# Patient Record
Sex: Female | Born: 1990 | Race: Black or African American | Hispanic: No | Marital: Married | Smoking: Never smoker
Health system: Southern US, Community
[De-identification: ages and names within clinical notes are randomized; demographics above are authoritative.]

## PROBLEM LIST (undated history)

## (undated) ENCOUNTER — Inpatient Hospital Stay (HOSPITAL_COMMUNITY): Payer: Self-pay

## (undated) DIAGNOSIS — N63 Unspecified lump in unspecified breast: Secondary | ICD-10-CM

## (undated) DIAGNOSIS — D649 Anemia, unspecified: Secondary | ICD-10-CM

## (undated) DIAGNOSIS — J189 Pneumonia, unspecified organism: Secondary | ICD-10-CM

## (undated) HISTORY — PX: FOOT SURGERY: SHX648

---
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
## 2012-08-31 ENCOUNTER — Encounter (HOSPITAL_COMMUNITY): Payer: Self-pay | Admitting: Emergency Medicine

## 2012-08-31 ENCOUNTER — Other Ambulatory Visit: Payer: Self-pay

## 2012-08-31 ENCOUNTER — Emergency Department (HOSPITAL_COMMUNITY)
Admission: EM | Admit: 2012-08-31 | Discharge: 2012-08-31 | Disposition: A | Discharge status: Home / Self Care | Payer: BC Managed Care – PPO | Attending: Emergency Medicine | Admitting: Emergency Medicine

## 2012-08-31 DIAGNOSIS — J111 Influenza due to unidentified influenza virus with other respiratory manifestations: Secondary | ICD-10-CM

## 2012-08-31 DIAGNOSIS — R509 Fever, unspecified: Secondary | ICD-10-CM | POA: Insufficient documentation

## 2012-08-31 DIAGNOSIS — J45909 Unspecified asthma, uncomplicated: Secondary | ICD-10-CM | POA: Insufficient documentation

## 2012-08-31 DIAGNOSIS — IMO0001 Reserved for inherently not codable concepts without codable children: Secondary | ICD-10-CM | POA: Insufficient documentation

## 2012-08-31 DIAGNOSIS — R059 Cough, unspecified: Secondary | ICD-10-CM | POA: Insufficient documentation

## 2012-08-31 DIAGNOSIS — R062 Wheezing: Secondary | ICD-10-CM | POA: Insufficient documentation

## 2012-08-31 DIAGNOSIS — R55 Syncope and collapse: Secondary | ICD-10-CM | POA: Insufficient documentation

## 2012-08-31 DIAGNOSIS — R112 Nausea with vomiting, unspecified: Secondary | ICD-10-CM | POA: Insufficient documentation

## 2012-08-31 DIAGNOSIS — J029 Acute pharyngitis, unspecified: Secondary | ICD-10-CM | POA: Insufficient documentation

## 2012-08-31 DIAGNOSIS — R05 Cough: Secondary | ICD-10-CM | POA: Insufficient documentation

## 2012-08-31 DIAGNOSIS — R52 Pain, unspecified: Secondary | ICD-10-CM | POA: Insufficient documentation

## 2012-08-31 DIAGNOSIS — Z79899 Other long term (current) drug therapy: Secondary | ICD-10-CM | POA: Insufficient documentation

## 2012-08-31 LAB — PREGNANCY, URINE: Preg Test, Ur: NEGATIVE

## 2012-08-31 LAB — POCT I-STAT, CHEM 8
Chloride: 107 mEq/L (ref 96–112)
HCT: 35 % — ABNORMAL LOW (ref 36.0–46.0)
Potassium: 2.9 mEq/L — ABNORMAL LOW (ref 3.5–5.1)

## 2012-08-31 MED ORDER — SODIUM CHLORIDE 0.9 % IV BOLUS (SEPSIS)
1000.0000 mL | Freq: Once | INTRAVENOUS | Status: AC
  Administered 2012-08-31: 1000 mL via INTRAVENOUS

## 2012-08-31 MED ORDER — MAGNESIUM SULFATE 40 MG/ML IJ SOLN
2.0000 g | Freq: Once | INTRAMUSCULAR | Status: AC
  Administered 2012-08-31: 2 g via INTRAVENOUS
  Filled 2012-08-31: qty 50

## 2012-08-31 MED ORDER — DIPHENHYDRAMINE HCL 50 MG/ML IJ SOLN
25.0000 mg | Freq: Once | INTRAMUSCULAR | Status: AC
  Administered 2012-08-31: 25 mg via INTRAVENOUS

## 2012-08-31 MED ORDER — ALBUTEROL SULFATE (5 MG/ML) 0.5% IN NEBU
5.0000 mg | INHALATION_SOLUTION | Freq: Once | RESPIRATORY_TRACT | Status: AC
  Administered 2012-08-31: 5 mg via RESPIRATORY_TRACT
  Filled 2012-08-31: qty 1

## 2012-08-31 MED ORDER — METHYLPREDNISOLONE SODIUM SUCC 125 MG IJ SOLR
125.0000 mg | Freq: Once | INTRAMUSCULAR | Status: AC
  Administered 2012-08-31: 125 mg via INTRAVENOUS
  Filled 2012-08-31 (×2): qty 2

## 2012-08-31 MED ORDER — DIPHENHYDRAMINE HCL 50 MG/ML IJ SOLN
INTRAMUSCULAR | Status: AC
  Filled 2012-08-31: qty 1

## 2012-08-31 MED ORDER — POTASSIUM CHLORIDE CRYS ER 20 MEQ PO TBCR
40.0000 meq | EXTENDED_RELEASE_TABLET | Freq: Once | ORAL | Status: AC
  Administered 2012-08-31: 40 meq via ORAL
  Filled 2012-08-31: qty 2

## 2012-08-31 MED ORDER — POTASSIUM CHLORIDE CRYS ER 20 MEQ PO TBCR
40.0000 meq | EXTENDED_RELEASE_TABLET | Freq: Two times a day (BID) | ORAL | Status: DC
Start: 2012-08-31 — End: 2012-08-31
  Administered 2012-08-31: 40 meq via ORAL
  Filled 2012-08-31: qty 2

## 2012-08-31 MED ORDER — ONDANSETRON 8 MG PO TBDP: 8.0000 mg | ORAL_TABLET | Freq: Three times a day (TID) | ORAL | Status: DC | PRN

## 2012-08-31 MED ORDER — ONDANSETRON HCL 4 MG/2ML IJ SOLN
4.0000 mg | Freq: Once | INTRAMUSCULAR | Status: AC
  Administered 2012-08-31: 4 mg via INTRAVENOUS
  Filled 2012-08-31: qty 2

## 2012-08-31 MED ORDER — IPRATROPIUM BROMIDE 0.02 % IN SOLN
0.5000 mg | Freq: Once | RESPIRATORY_TRACT | Status: AC
  Administered 2012-08-31: 0.5 mg via RESPIRATORY_TRACT
  Filled 2012-08-31: qty 2.5

## 2012-08-31 NOTE — ED Notes (Signed)
All signs of allergic reaction has resolved, no hives noted to right arm. Airway intact

## 2012-08-31 NOTE — ED Notes (Addendum)
Onset 2 days ago general body achy, productive cough unsure what sputum looked like, fever 103.0 seen Primary Doctor one day ago and given tamiflu, steroid, and antibiotic. Today felt passing out seeing dark.  Currently ax4 answering and following commands appropriate.  Headache 5/10 throbbing and lower middle abdominal pain 5/10 achy. Chest pain 5/10 achy

## 2012-08-31 NOTE — ED Notes (Signed)
Pt having reaction to meds, has redness and hives noted to right forearm and wrist area, informed Josh, PA and given benadryl ivp. Airway remains intact. spo2 100% on room air.

## 2012-08-31 NOTE — ED Provider Notes (Signed)
History     CSN: [id number]  Arrival date & time 08/31/12  1037   First MD Initiated Contact with Patient 08/31/12 1059      Chief Complaint  Patient presents with  . Cough  . Generalized Body Aches  . Near Syncope    (Consider location/radiation/quality/duration/timing/severity/associated sxs/prior treatment) HPI Comments: Patient with history of asthma presents with two-day history of flulike symptoms in the near syncopal episode this morning. Patient began having body aches, fever to 103F, cough with wheezing, sore throat, nausea/vomiting 2 days ago. Patient saw her primary care physician yesterday and was started on azithromycin, Tamiflu, and prednisone. She has not really been able to keep thesemedications down. This morning, the patient states that she had darkness of her vision while she was getting dressed. This was transient in nature. Patient did not have full syncope. She did not have chest pain or palpitations. No risk factors for PE other than recent drive to and from [place]. Patient states that she got up and walked around every 2-3 hours. She denies leg swelling or tenderness. Onset acute. Course is constant. Nothing makes symptoms better or worse.  Patient is a 22 y.o. female presenting with cough. The history is provided by the patient.  Cough Associated symptoms include chills, sore throat and myalgias. Pertinent negatives include no ear pain, no headaches, no rhinorrhea, no shortness of breath, no wheezing and no eye redness.    Past Medical History  Diagnosis Date  . Asthma     History reviewed. No pertinent past surgical history.  No family history on file.  History  Substance Use Topics  . Smoking status: Never Smoker   . Smokeless tobacco: Not on file  . Alcohol Use: No    OB History    Grav Para Term Preterm Abortions TAB SAB Ect Mult Living                  Review of Systems  Constitutional: Positive for fever, chills and fatigue.  HENT:  Positive for sore throat. Negative for ear pain, congestion, rhinorrhea, neck stiffness and sinus pressure.   Eyes: Negative for redness.  Respiratory: Positive for cough. Negative for shortness of breath and wheezing.   Cardiovascular: Negative for leg swelling.  Gastrointestinal: Positive for nausea and vomiting. Negative for abdominal pain and diarrhea.  Genitourinary: Negative for dysuria.  Musculoskeletal: Positive for myalgias.  Skin: Negative for rash.  Neurological: Negative for headaches.  Hematological: Negative for adenopathy.    Allergies  Review of patient's allergies indicates no known allergies.  Home Medications   Current Outpatient Rx  Name  Route  Sig  Dispense  Refill  . ALBUTEROL SULFATE HFA 108 (90 BASE) MCG/ACT IN AERS   Inhalation   Inhale 2 puffs into the lungs every 6 (six) hours as needed. For shortness of breath         . ALBUTEROL SULFATE (2.5 MG/3ML) 0.083% IN NEBU   Nebulization   Take 2.5 mg by nebulization every 6 (six) hours as needed.         . AZITHROMYCIN 250 MG PO TABS   Oral   Take 250 mg by mouth daily. 2 tablets on day 1, then 1 tablet on days 2-5         . OSELTAMIVIR PHOSPHATE 75 MG PO CAPS   Oral   Take 75 mg by mouth 2 (two) times daily.         Marland Kitchen PREDNISONE 20 MG PO TABS  Oral   Take 20 mg by mouth daily.         Marland Kitchen PSEUDOEPHEDRINE-NAPROXEN NA ER 120-220 MG PO TB12   Oral   Take 1 tablet by mouth 2 (two) times daily as needed. For pain/cough           BP 114/69  Pulse 108  Temp 99 F (37.2 C) (Oral)  Resp 26  SpO2 100%  Physical Exam  Nursing note and vitals reviewed. Constitutional: She appears well-developed and well-nourished.  HENT:  Head: Normocephalic and atraumatic. No trismus in the jaw.  Right Ear: Tympanic membrane, external ear and ear canal normal.  Left Ear: Tympanic membrane, external ear and ear canal normal.  Nose: Nose normal. No mucosal edema or rhinorrhea.  Mouth/Throat: Uvula is  midline, oropharynx is clear and moist and mucous membranes are normal. Mucous membranes are not dry. No oral lesions. No uvula swelling. No oropharyngeal exudate, posterior oropharyngeal edema, posterior oropharyngeal erythema or tonsillar abscesses.  Eyes: Conjunctivae normal are normal. Right eye exhibits no discharge. Left eye exhibits no discharge.  Neck: Normal range of motion. Neck supple.  Cardiovascular: Normal heart sounds.  Tachycardia present.   Pulmonary/Chest: Effort normal and breath sounds normal. No respiratory distress. She has no wheezes. She has no rales.  Abdominal: Soft. There is no tenderness.  Lymphadenopathy:    She has no cervical adenopathy.  Neurological: She is alert.  Skin: Skin is warm and dry.  Psychiatric: She has a normal mood and affect.    ED Course  Procedures (including critical care time)  Labs Reviewed  POCT I-STAT, CHEM 8 - Abnormal; Notable for the following:    Potassium 2.9 (*)     Hemoglobin 11.9 (*)     HCT 35.0 (*)     All other components within normal limits  PREGNANCY, URINE   No results found.   1. Influenza-like illness     11:09 AM Patient seen and examined. Work-up initiated. Medications ordered.   Vital signs reviewed and are as follows: Filed Vitals:   08/31/12 1058  BP: 114/69  Pulse: 108  Temp: 99 F (37.2 C)  Resp: 26   11:43 AM Patient had hives develop proximal to R IV site after administration of zofran and solumedrol. No h/o allergies. Appears localized. No resp distress. Benadryl ordered.   1:49 PM D/w Dr. Rhunette Croft. HR 85 bpm. Mg and K ordered earlier. On re-exam patient is having more significant wheezing than earlier. Albuterol ordered. Previous prednisone and Mg should help as well.   2:48 PM Patient states she her breathing is much better after breathing treatment. She states she does not feel like she needs another treatment. She will now try PO challenge. If she does okay, she will go home with her  previous medications.   Patient appears well and comfortable. HR 100 after albuterol. Wheezing improved. Mild rhonchi now that clears with cough. EKG reviewed with Dr. Rhunette Croft.    Date: 08/31/2012  Rate: 105  Rhythm: sinus tachycardia  QRS Axis: normal  Intervals: normal  ST/T Wave abnormalities: nonspecific T wave changes  Conduction Disutrbances:none  Narrative Interpretation:   Old EKG Reviewed: none available  3:19 PM Re-exam: patient appears well. HR is 90. Questions answered. Tolerating POs in room. Home with zofran for N/V.   Patient discharged to home. Encouraged to rest and drink plenty of fluids.  Patient told to return to ED or see their primary doctor if their symptoms worsen, high fever not controlled with  tylenol, persistent vomiting, they feel they are dehydrated, or if they have any other concerns.  Patient verbalized understanding and agreed with plan.     MDM  Patient with symptoms consistent with influenza in setting of asthma: body aches, fever, cough, wheezing, muscle aches. Symptoms improved with supportive treatments in ED. Do not suspect PE given this clinical picture: no LE edema, pain, or swelling. No h/o blood clot or recent prolonged travel. No FH of hypercoagulability. No estrogens. Tachycardia, near syncope likely 2/2 dehydration -- improved with initial fluid bolus.         Renne Crigler, [place] 08/31/12 [phone]

## 2012-09-01 NOTE — ED Provider Notes (Signed)
Medical screening examination/treatment/procedure(s) were performed by non-physician practitioner and as supervising physician I was immediately available for consultation/collaboration.  Verdella Laidlaw, MD 09/01/12 1055 

## 2013-10-19 ENCOUNTER — Encounter (HOSPITAL_COMMUNITY): Payer: Self-pay | Admitting: Emergency Medicine

## 2013-10-19 ENCOUNTER — Emergency Department (HOSPITAL_COMMUNITY)
Admission: EM | Admit: 2013-10-19 | Discharge: 2013-10-19 | Disposition: A | Discharge status: Home / Self Care | Payer: BC Managed Care – PPO | Attending: Emergency Medicine | Admitting: Emergency Medicine

## 2013-10-19 ENCOUNTER — Emergency Department (HOSPITAL_COMMUNITY): Payer: BC Managed Care – PPO

## 2013-10-19 DIAGNOSIS — Z79899 Other long term (current) drug therapy: Secondary | ICD-10-CM | POA: Insufficient documentation

## 2013-10-19 DIAGNOSIS — R0789 Other chest pain: Secondary | ICD-10-CM | POA: Insufficient documentation

## 2013-10-19 DIAGNOSIS — J45901 Unspecified asthma with (acute) exacerbation: Secondary | ICD-10-CM | POA: Insufficient documentation

## 2013-10-19 MED ORDER — PREDNISONE 20 MG PO TABS: 40.0000 mg | ORAL_TABLET | Freq: Every day | ORAL | Status: DC

## 2013-10-19 MED ORDER — PREDNISONE 20 MG PO TABS
40.0000 mg | ORAL_TABLET | Freq: Once | ORAL | Status: AC
  Administered 2013-10-19: 40 mg via ORAL
  Filled 2013-10-19: qty 2

## 2013-10-19 MED ORDER — ALBUTEROL SULFATE (2.5 MG/3ML) 0.083% IN NEBU: 2.5000 mg | INHALATION_SOLUTION | Freq: Four times a day (QID) | RESPIRATORY_TRACT | Status: DC | PRN

## 2013-10-19 MED ORDER — ALBUTEROL SULFATE HFA 108 (90 BASE) MCG/ACT IN AERS: 2.0000 | INHALATION_SPRAY | Freq: Four times a day (QID) | RESPIRATORY_TRACT | Status: DC | PRN

## 2013-10-19 MED ORDER — IPRATROPIUM-ALBUTEROL 0.5-2.5 (3) MG/3ML IN SOLN
5.0000 mL | Freq: Once | RESPIRATORY_TRACT | Status: AC
  Administered 2013-10-19: 5 mL via RESPIRATORY_TRACT
  Filled 2013-10-19: qty 3

## 2013-10-19 MED ORDER — IPRATROPIUM-ALBUTEROL 0.5-2.5 (3) MG/3ML IN SOLN
3.0000 mL | Freq: Once | RESPIRATORY_TRACT | Status: AC
  Administered 2013-10-19: 3 mL via RESPIRATORY_TRACT
  Filled 2013-10-19: qty 3

## 2013-10-19 NOTE — ED Notes (Signed)
Patient returned from X-ray 

## 2013-10-19 NOTE — ED Provider Notes (Signed)
CSN: [id number]     Arrival date & time 10/19/13  1031 History   First MD Initiated Contact with Patient 10/19/13 1102     Chief Complaint  Patient presents with  . Asthma  . Chest Pain     (Consider location/radiation/quality/duration/timing/severity/associated sxs/prior Treatment) HPI Comments: Lauren Thornton is a 23 year-old female with a past medical history of asthma, presenting the Emergency Department with a chief complaint of dyspnea for approximately 72 hours.  The patient reports using her nebulizer and albuterol inhaler without relief, last use 0700.  She reports associated chest tightness.  The patient reports productive cough.  She reports she has run out of her Albuterol nebulizer and inhaler   The history is provided by the patient, medical records and a parent. No language interpreter was used.    Past Medical History  Diagnosis Date  . Asthma    Past Surgical History  Procedure Laterality Date  . Foot surgery Right    No family history on file. History  Substance Use Topics  . Smoking status: Never Smoker   . Smokeless tobacco: Not on file  . Alcohol Use: No   OB History   Grav Para Term Preterm Abortions TAB SAB Ect Mult Living                 Review of Systems  Constitutional: Negative for fever and chills.  Respiratory: Positive for cough, chest tightness, shortness of breath and wheezing.   Cardiovascular: Negative for leg swelling.  Gastrointestinal: Negative for abdominal pain.      Allergies  Review of patient's allergies indicates no known allergies.  Home Medications   Current Outpatient Rx  Name  Route  Sig  Dispense  Refill  . albuterol (PROVENTIL HFA;VENTOLIN HFA) 108 (90 BASE) MCG/ACT inhaler   Inhalation   Inhale 2 puffs into the lungs every 6 (six) hours as needed. For shortness of breath         . albuterol (PROVENTIL) (2.5 MG/3ML) 0.083% nebulizer solution   Nebulization   Take 2.5 mg by nebulization every 6 (six) hours as  needed.         . Pseudoephedrine-Naproxen Na (ALEVE-D SINUS & COLD) 120-220 MG TB12   Oral   Take 1 tablet by mouth 2 (two) times daily as needed. For pain/cough         . albuterol (PROVENTIL HFA;VENTOLIN HFA) 108 (90 BASE) MCG/ACT inhaler   Inhalation   Inhale 2 puffs into the lungs every 6 (six) hours as needed for wheezing or shortness of breath.   1 Inhaler   1   . albuterol (PROVENTIL) (2.5 MG/3ML) 0.083% nebulizer solution   Nebulization   Take 3 mLs (2.5 mg total) by nebulization every 6 (six) hours as needed for wheezing or shortness of breath.   75 mL   12   . predniSONE (DELTASONE) 20 MG tablet   Oral   Take 2 tablets (40 mg total) by mouth daily. Take 40 mg by mouth daily for 3 days, then 20mg  by mouth daily for 3 days, then 10mg  daily for 3 days   12 tablet   0    BP 108/62  Pulse 90  Temp(Src) 98 F (36.7 C) (Oral)  Resp 20  Wt 135 lb (61.236 kg)  SpO2 100%  LMP 09/17/2013 Physical Exam  Nursing note and vitals reviewed. Constitutional: She is oriented to person, place, and time. She appears well-developed and well-nourished.  HENT:  Head: Normocephalic and atraumatic.  Neck: Neck supple.  Cardiovascular: Normal rate and regular rhythm.   Pulmonary/Chest: Effort normal. Not tachypneic. No respiratory distress. She has no decreased breath sounds. She has wheezes. She has rhonchi in the left upper field and the left lower field.  Patient is able to speak in complete sentences.    Abdominal: Soft. There is no tenderness. There is no rebound.  Musculoskeletal: Normal range of motion.  Neurological: She is alert and oriented to person, place, and time.  Skin: Skin is dry.  Psychiatric: She has a normal mood and affect. Her behavior is normal.    ED Course  Procedures (including critical care time) Labs Review Labs Reviewed - No data to display Imaging Review Dg Chest 2 View (if Patient Has Fever And/or Copd)  10/19/2013   CLINICAL DATA:  Asthma,  cough, chest pain, history pneumonia  EXAM: CHEST  2 VIEW  COMPARISON:  12/02/2005  FINDINGS: Normal heart size, mediastinal contours, and pulmonary vascularity.  Lungs clear.  Bones unremarkable.  No pneumothorax.  IMPRESSION: Normal exam.   Electronically Signed   By: Lavonia Dana M.D.   On: 10/19/2013 11:52    EKG Interpretation    Date/Time:  Sunday October 19 2013 10:36:42 EST Ventricular Rate:  97 PR Interval:  174 QRS Duration: 82 QT Interval:  344 QTC Calculation: 436 R Axis:   82 Text Interpretation:  Normal sinus rhythm Normal ECG No significant change since last tracing Confirmed by STEINL  MD, KEVIN ([id number]) on 10/19/2013 12:11:31 PM            MDM   Final diagnoses:  Asthma exacerbation   Pt with a history of asthma. Rhonchi on exam, XR to rule out pneumonia.  Oxygen saturation 100% on RA.  duoneb and prednisone ordered. Chest XR without pneumonia or acute findings.  EKG normal, no significant change since last tracing. 1223: re-eval pt reports chest tightness has improved.  Reports decrease in wheezing.  Mild rhonchi in Left upper lobe  deuoneb x 2 and reevaluate. Oxygen saturation 100% on RA 1245: Re-eval: Pt lungs clear to ausculation in all fields.  Requesting refill of all medicaitons.  Discussed lab results, imaging results, and treatment plan with the patient. Return precautions given. Reports understanding and no other concerns at this time.  Patient is stable for discharge at this time.  Meds given in ED:  Medications  ipratropium-albuterol (DUONEB) 0.5-2.5 (3) MG/3ML nebulizer solution 5 mL (5 mLs Nebulization Given 10/19/13 1121)  predniSONE (DELTASONE) tablet 40 mg (40 mg Oral Given 10/19/13 1120)  ipratropium-albuterol (DUONEB) 0.5-2.5 (3) MG/3ML nebulizer solution 3 mL (3 mLs Nebulization Given 10/19/13 1234)    New Prescriptions   ALBUTEROL (PROVENTIL HFA;VENTOLIN HFA) 108 (90 BASE) MCG/ACT INHALER    Inhale 2 puffs into the lungs every 6 (six) hours as  needed for wheezing or shortness of breath.   ALBUTEROL (PROVENTIL) (2.5 MG/3ML) 0.083% NEBULIZER SOLUTION    Take 3 mLs (2.5 mg total) by nebulization every 6 (six) hours as needed for wheezing or shortness of breath.   PREDNISONE (DELTASONE) 20 MG TABLET    Take 2 tablets (40 mg total) by mouth daily. Take 40 mg by mouth daily for 3 days, then 20mg  by mouth daily for 3 days, then 10mg  daily for 3 days      Lorrine Kin, PA-C 10/19/13 1256

## 2013-10-19 NOTE — ED Notes (Signed)
Patient transported to X-ray at this time (breathing treatment complete).

## 2013-10-19 NOTE — Discharge Instructions (Signed)
Call for a follow up appointment with a Family or Primary Care Provider.  °Return if Symptoms worsen.   °Take medication as prescribed.  ° ° °Emergency Department Resource Guide °1) Find a Doctor and Pay Out of Pocket °Although you won't have to find out who is covered by your insurance plan, it is a good idea to ask around and get recommendations. You will then need to call the office and see if the doctor you have chosen will accept you as a new patient and what types of options they offer for patients who are self-pay. Some doctors offer discounts or will set up payment plans for their patients who do not have insurance, but you will need to ask so you aren't surprised when you get to your appointment. ° °2) Contact Your Local Health Department °Not all health departments have doctors that can see patients for sick visits, but many do, so it is worth a call to see if yours does. If you don't know where your local health department is, you can check in your phone book. The CDC also has a tool to help you locate your state's health department, and many state websites also have listings of all of their local health departments. ° °3) Find a Walk-in Clinic °If your illness is not likely to be very severe or complicated, you may want to try a walk in clinic. These are popping up all over the country in pharmacies, drugstores, and shopping centers. They're usually staffed by nurse practitioners or physician assistants that have been trained to treat common illnesses and complaints. They're usually fairly quick and inexpensive. However, if you have serious medical issues or chronic medical problems, these are probably not your best option. ° °No Primary Care Doctor: °- Call Health Connect at  [phone] - they can help you locate a primary care doctor that  accepts your insurance, provides certain services, etc. °- Physician Referral Service- [phone] ° °Chronic Pain Problems: °Organization         Address  Phone    Notes  °[facility]  [phone] Patients need to be referred by their primary care doctor.  ° °Medication Assistance: °Organization         Address  Phone   Notes  °[place] [facility] [address] °[place], [place] [address] [phone] --Must be a resident of [place] °-- Must have NO insurance coverage whatsoever (no Medicaid/ Medicare, etc.) °-- The pt. MUST have a primary care doctor that directs their care regularly and follows them in the community °  °[facility]  [phone]   °[employer]  (888) 892-1162   ° °Agencies that provide inexpensive medical care: °Organization         Address  Phone   Notes  °[facility]  [phone]   °[facility]    [phone]   °[facility] [address] °[place], [place] [address] [phone]   °[facility] [address], °[place] [phone]   °[facility]    [phone]   °[facility]    [phone]   °[facility] ° [address], [place] Phone:  [phone], Fax:  [phone] Hours of Operation:  9 am - 6 pm, M-F.  Also accepts Medicaid/Medicare and self-pay.  °[facility] ° [address], [place]   [place] Phone: [phone], Fax: [phone]. Hours of Operation:  8:30 am - 5:30 pm, M-F.  Also accepts Medicaid and self-pay.  [facility] [address], [place] Phone: [phone]   [place], [place], [place] [phone], Ext. [phone] Mondays & Thursdays: 7-9 AM.  First 15 patients are seen on a first come, first serve basis.    [place] Providers:  Organization         Address  Phone   Notes  [facility] [address], [place] [phone] Also accepts self-pay patients.  [facility] [address], [place]  [phone]   [place], [address], [place] [phone]   [facility] [address], [place] [phone]   Lucianne Lei [address], [place]   [phone] Only accepts [place] Access [place] patients after they have their name applied to their card.   Self-Pay (no insurance) in [facility]:  Organization         Address  Phone   Notes  Sickle Cell Patients, [facility] [place] [phone]   [facility] [place] [phone]   [facility] [place]  [place], [place], [place] [phone]   [facility]/Dr. Osei-Bonsu  [address], [place] or [address], [place] [phone] Phone number for both [place] and [place] locations is the same.  [facility] [address], [place] [phone]   [facility] [address] [place] or [address] [phone]   [facility] [address], [place] [phone], phone; [phone], fax Sees patients 1st and 3rd Saturday of every month.  Must not qualify for public or private insurance (i.e. Medicaid, Medicare, [place] Health Choice, Veterans' Benefits)  Household income should be no more than 200% of the poverty level The clinic cannot treat you if you are pregnant or think you are pregnant  Sexually transmitted diseases are not treated at the clinic.    Dental Care: Organization         Address  Phone  Notes  [facility] Department of [facility] [phone] Accepts children up to age 47 who are enrolled in [place] or [place]; pregnant women with a Medicaid card; and  children who have applied for Medicaid or [place] Health Choice, but were declined, whose parents can pay a reduced fee at time of service.  [facility] Department of Operating Room Services  [address], [place] [phone] Accepts children up to age 20 who are enrolled in [place] or [place]; pregnant women with a Medicaid card; and children who have applied for Medicaid or [place] Health Choice, but were declined, whose parents can pay a reduced fee at time of service.  [facility]  [place] [phone] Patients are seen by appointment only. Walk-ins are not accepted. [place] will see patients 24 years of age and older. Monday - Tuesday (8am-5pm) Most Wednesdays (8:30-5pm) $30 per visit, cash only  [place]  [place]  [address], [facility] [phone] Patients are seen by appointment only. Walk-ins are not accepted. [place] will see patients 48 years of age and older. One Wednesday Evening (Monthly: Volunteer Based).  $30 per visit, cash only  [place]  [phone] for adults; Children under age 72, call Graduate Pediatric Dentistry at [phone]. Children aged 24-14, please call [phone] to request a pediatric application.  Dental services are provided in all areas of dental care including fillings, crowns and bridges, complete and partial dentures, implants, gum treatment, root canals, and extractions. Preventive care is also provided. Treatment is provided to both adults and children. Patients are selected via a lottery and there is often a waiting list.   [facility] [address], [place]  [phone] [email or web address]   [facility] [address] [place], [place] [phone], Ext. [phone] Second and Fourth Thursday of each month, opens at 6:30 AM; Clinic ends at 9 AM.  Patients are seen on a first-come first-served  basis, and a limited number are seen during each clinic.   [facility]  [address] Hillard Danker [place], [place] [phone]   Eligibility Requirements You must have lived in [place], [place], or [place] counties for at least the last three months.   You cannot be eligible for state or federal sponsored [employer], including [employer], [place], or [employer].   You generally cannot be eligible for healthcare insurance through your employer.    How to apply: Eligibility screenings are held every Tuesday and Wednesday afternoon from 1:00 pm until 4:00 pm. You do not need an appointment for the interview!  [facility] [address], [place], [place]   [place]  [phone]

## 2013-10-19 NOTE — ED Notes (Addendum)
Pt reports began with cold symptoms about 2-3 days ago that led to asthma symptoms. Pt reports right sided chest pain non radiating with shortness of breath and slight sweating. Pt talking in complete sentences without difficulty. Last neb treatment was this morning around 0700.

## 2013-10-20 NOTE — ED Provider Notes (Signed)
Medical screening examination/treatment/procedure(s) were conducted as a shared visit with non-physician practitioner(s) and myself.  I personally evaluated the patient during the encounter.  EKG Interpretation    Date/Time:  Sunday October 19 2013 10:36:42 EST Ventricular Rate:  97 PR Interval:  174 QRS Duration: 82 QT Interval:  344 QTC Calculation: 436 R Axis:   82 Text Interpretation:  Normal sinus rhythm Normal ECG No significant change since last tracing Confirmed by Delbert Vu  MD, Kiara Keep ([id number]) on 10/19/2013 12:11:31 PM            Pt with hx asthma, c/o non prod cough and wheezing. Wheezing bil on exam. Good air exchange. Alb neb.  Mirna Mires, MD 10/20/13 [phone]

## 2014-02-20 ENCOUNTER — Encounter (HOSPITAL_COMMUNITY): Payer: Self-pay | Admitting: Emergency Medicine

## 2014-02-20 DIAGNOSIS — Z792 Long term (current) use of antibiotics: Secondary | ICD-10-CM | POA: Insufficient documentation

## 2014-02-20 DIAGNOSIS — R197 Diarrhea, unspecified: Secondary | ICD-10-CM | POA: Insufficient documentation

## 2014-02-20 DIAGNOSIS — R42 Dizziness and giddiness: Secondary | ICD-10-CM | POA: Insufficient documentation

## 2014-02-20 DIAGNOSIS — J45909 Unspecified asthma, uncomplicated: Secondary | ICD-10-CM | POA: Insufficient documentation

## 2014-02-20 DIAGNOSIS — R51 Headache: Secondary | ICD-10-CM | POA: Insufficient documentation

## 2014-02-20 DIAGNOSIS — R110 Nausea: Secondary | ICD-10-CM | POA: Insufficient documentation

## 2014-02-20 DIAGNOSIS — E860 Dehydration: Secondary | ICD-10-CM | POA: Insufficient documentation

## 2014-02-20 LAB — CBC WITH DIFFERENTIAL/PLATELET
BASOS ABS: 0 10*3/uL (ref 0.0–0.1)
Basophils Relative: 1 % (ref 0–1)
EOS ABS: 0.1 10*3/uL (ref 0.0–0.7)
EOS PCT: 1 % (ref 0–5)
HEMATOCRIT: 35.9 % — AB (ref 36.0–46.0)
Hemoglobin: 12 g/dL (ref 12.0–15.0)
LYMPHS PCT: 51 % — AB (ref 12–46)
Lymphs Abs: 2.9 10*3/uL (ref 0.7–4.0)
MCH: 28.9 pg (ref 26.0–34.0)
MCHC: 33.4 g/dL (ref 30.0–36.0)
MCV: 86.5 fL (ref 78.0–100.0)
MONO ABS: 0.4 10*3/uL (ref 0.1–1.0)
Monocytes Relative: 7 % (ref 3–12)
Neutro Abs: 2.2 10*3/uL (ref 1.7–7.7)
Neutrophils Relative %: 40 % — ABNORMAL LOW (ref 43–77)
Platelets: 338 10*3/uL (ref 150–400)
RBC: 4.15 MIL/uL (ref 3.87–5.11)
RDW: 12.9 % (ref 11.5–15.5)
WBC: 5.6 10*3/uL (ref 4.0–10.5)

## 2014-02-20 NOTE — ED Notes (Signed)
Pt. report diarrhea with nausea for 1 week after eating fruit in a party , denies emesis or fever . No abdominal pain .

## 2014-02-21 ENCOUNTER — Emergency Department (HOSPITAL_COMMUNITY)
Admission: EM | Admit: 2014-02-21 | Discharge: 2014-02-21 | Disposition: A | Discharge status: Home / Self Care | Payer: BC Managed Care – PPO | Attending: Emergency Medicine | Admitting: Emergency Medicine

## 2014-02-21 DIAGNOSIS — R197 Diarrhea, unspecified: Secondary | ICD-10-CM

## 2014-02-21 LAB — COMPREHENSIVE METABOLIC PANEL
ALT: 10 U/L (ref 0–35)
AST: 16 U/L (ref 0–37)
Albumin: 3.7 g/dL (ref 3.5–5.2)
Alkaline Phosphatase: 97 U/L (ref 39–117)
BUN: 10 mg/dL (ref 6–23)
CALCIUM: 9.6 mg/dL (ref 8.4–10.5)
CO2: 25 meq/L (ref 19–32)
CREATININE: 0.75 mg/dL (ref 0.50–1.10)
Chloride: 103 mEq/L (ref 96–112)
GLUCOSE: 92 mg/dL (ref 70–99)
Potassium: 3.9 mEq/L (ref 3.7–5.3)
Sodium: 140 mEq/L (ref 137–147)
TOTAL PROTEIN: 7.3 g/dL (ref 6.0–8.3)
Total Bilirubin: <0.2 mg/dL — ABNORMAL LOW (ref 0.3–1.2)

## 2014-02-21 MED ORDER — SODIUM CHLORIDE 0.9 % IV BOLUS (SEPSIS)
1000.0000 mL | Freq: Once | INTRAVENOUS | Status: AC
  Administered 2014-02-21: 1000 mL via INTRAVENOUS

## 2014-02-21 MED ORDER — ACETAMINOPHEN 325 MG PO TABS
650.0000 mg | ORAL_TABLET | Freq: Once | ORAL | Status: AC
  Administered 2014-02-21: 650 mg via ORAL
  Filled 2014-02-21: qty 2

## 2014-02-21 NOTE — Discharge Instructions (Signed)
Hold antibiotics.  If you were given medicines take as directed.  If you are on coumadin or contraceptives realize their levels and effectiveness is altered by many different medicines.  If you have any reaction (rash, tongues swelling, other) to the medicines stop taking and see a physician.   Please follow up as directed and return to the ER or see a physician for new or worsening symptoms.  Thank you. Filed Vitals:   02/20/14 2332 02/21/14 0145 02/21/14 0241 02/21/14 0254  BP: 124/79 123/87 107/46 120/73  Pulse: 87 61 76 64  Temp: 98 F (36.7 C)     Resp: 20 21 18 20   SpO2: 100% 100% 98% 99%

## 2014-02-21 NOTE — ED Provider Notes (Signed)
CSN: [id number]     Arrival date & time 02/20/14  2321 History   First MD Initiated Contact with Patient 02/21/14 0107     Chief Complaint  Patient presents with  . Diarrhea     (Consider location/radiation/quality/duration/timing/severity/associated sxs/prior Treatment) HPI Comments: 23 year old female with no significant medical history, no recent antibiotics, no smoking or drinking presents with recurrent diarrhea since one week prior after eating fruit at a party. Patient has had 5-7 episodes a day of watery diarrhea no blood. No sick contacts or recent travel. No recent antibiotics or new medications except patient is placed on Cipro the urgent care however no improvement in symptoms and possibly worsening. No GI medical history. Patient is not vomiting and tolerating by mouth. Mild lightheadedness with standing.  Patient is a 23 y.o. female presenting with diarrhea. The history is provided by the patient.  Diarrhea Associated symptoms: no abdominal pain, no chills, no fever, no headaches and no vomiting     Past Medical History  Diagnosis Date  . Asthma    Past Surgical History  Procedure Laterality Date  . Foot surgery Right    No family history on file. History  Substance Use Topics  . Smoking status: Never Smoker   . Smokeless tobacco: Not on file  . Alcohol Use: No   OB History   Grav Para Term Preterm Abortions TAB SAB Ect Mult Living                 Review of Systems  Constitutional: Negative for fever and chills.  HENT: Negative for congestion.   Eyes: Negative for visual disturbance.  Respiratory: Negative for shortness of breath.   Cardiovascular: Negative for chest pain.  Gastrointestinal: Positive for nausea and diarrhea. Negative for vomiting and abdominal pain.  Genitourinary: Negative for dysuria and flank pain.  Musculoskeletal: Negative for back pain, neck pain and neck stiffness.  Skin: Negative for rash.  Neurological: Negative for  light-headedness and headaches.      Allergies  Review of patient's allergies indicates no known allergies.  Home Medications   Prior to Admission medications   Medication Sig Start Date End Date Taking? Authorizing Koury Roddy  albuterol (PROVENTIL HFA;VENTOLIN HFA) 108 (90 BASE) MCG/ACT inhaler Inhale 2 puffs into the lungs every 6 (six) hours as needed. For shortness of breath   Yes Historical Carmelita Amparo, MD  albuterol (PROVENTIL) (2.5 MG/3ML) 0.083% nebulizer solution Take 2.5 mg by nebulization every 6 (six) hours as needed for wheezing or shortness of breath.    Yes Historical Freida Nebel, MD  ciprofloxacin (CIPRO) 500 MG tablet Take 500 mg by mouth every 12 (twelve) hours. 02/15/14  Yes Historical Eren Puebla, MD  ondansetron (ZOFRAN-ODT) 8 MG disintegrating tablet Take 8 mg by mouth every 8 (eight) hours. 02/15/14  Yes Historical Jermani Eberlein, MD   BP 124/79  Pulse 87  Temp(Src) 98 F (36.7 C)  Resp 20  SpO2 100%  LMP 02/12/2014 Physical Exam  Nursing note and vitals reviewed. Constitutional: She is oriented to person, place, and time. She appears well-developed and well-nourished.  HENT:  Head: Normocephalic and atraumatic.  Mild diabetes membranes  Eyes: Conjunctivae are normal. Right eye exhibits no discharge. Left eye exhibits no discharge.  Neck: Normal range of motion. Neck supple. No tracheal deviation present.  Cardiovascular: Normal rate and regular rhythm.   Pulmonary/Chest: Effort normal and breath sounds normal.  Abdominal: Soft. She exhibits no distension. There is no tenderness. There is no guarding.  Musculoskeletal: She exhibits no edema.  Neurological: She is alert and oriented to person, place, and time.  Skin: Skin is warm. No rash noted.  Psychiatric: She has a normal mood and affect.    ED Course  Procedures (including critical care time) Labs Review Labs Reviewed  CBC WITH DIFFERENTIAL - Abnormal; Notable for the following:    HCT 35.9 (*)    Neutrophils  Relative % 40 (*)    Lymphocytes Relative 51 (*)    All other components within normal limits  COMPREHENSIVE METABOLIC PANEL - Abnormal; Notable for the following:    Total Bilirubin <0.2 (*)    All other components within normal limits    Imaging Review No results found.   EKG Interpretation None      MDM   Final diagnoses:  Diarrhea   Clinically mild dehydration and recurrent diarrhea with no red flags. Discussed holding antibiotics over the weekend and allowing the body to adjust. IV fluid bolus in ER and Tylenol from mild generalized headache.  Patient proven ER at IV fluids. Blood work unremarkable. Followup outpatient discussed. Patient had diarrhea prior to Cipro C. difficile was not ordered at this time. Patient be reassessed on Monday if no improvement in may require C. difficile testing at that time if persistent.  Results and differential diagnosis were discussed with the patient/parent/guardian. Close follow up outpatient was discussed, comfortable with the plan.   Medications  acetaminophen (TYLENOL) tablet 650 mg (650 mg Oral Given 02/21/14 0148)  sodium chloride 0.9 % bolus 1,000 mL (1,000 mLs Intravenous New Bag/Given 02/21/14 0220)    Filed Vitals:   02/20/14 2332 02/21/14 0145 02/21/14 0241 02/21/14 0254  BP: 124/79 123/87 107/46 120/73  Pulse: 87 61 76 64  Temp: 98 F (36.7 C)     Resp: 20 21 18 20   SpO2: 100% 100% 98% 99%       Mariea Clonts, MD 02/21/14 [phone]

## 2015-07-02 ENCOUNTER — Other Ambulatory Visit: Payer: Self-pay | Admitting: Family Medicine

## 2015-07-02 ENCOUNTER — Ambulatory Visit
Admission: RE | Admit: 2015-07-02 | Discharge: 2015-07-02 | Disposition: A | Discharge status: Home / Self Care | Payer: BLUE CROSS/BLUE SHIELD | Source: Ambulatory Visit | Attending: Family Medicine | Admitting: Family Medicine

## 2015-07-02 DIAGNOSIS — E010 Iodine-deficiency related diffuse (endemic) goiter: Secondary | ICD-10-CM

## 2015-07-02 DIAGNOSIS — E0789 Other specified disorders of thyroid: Secondary | ICD-10-CM

## 2015-07-17 ENCOUNTER — Encounter (HOSPITAL_COMMUNITY): Payer: Self-pay | Admitting: Nurse Practitioner

## 2015-07-17 ENCOUNTER — Emergency Department (HOSPITAL_COMMUNITY)
Admission: EM | Admit: 2015-07-17 | Discharge: 2015-07-17 | Disposition: A | Discharge status: Home / Self Care | Payer: BLUE CROSS/BLUE SHIELD | Attending: Emergency Medicine | Admitting: Emergency Medicine

## 2015-07-17 ENCOUNTER — Emergency Department (HOSPITAL_COMMUNITY): Payer: BLUE CROSS/BLUE SHIELD

## 2015-07-17 DIAGNOSIS — R0789 Other chest pain: Secondary | ICD-10-CM

## 2015-07-17 DIAGNOSIS — S29001A Unspecified injury of muscle and tendon of front wall of thorax, initial encounter: Secondary | ICD-10-CM | POA: Insufficient documentation

## 2015-07-17 DIAGNOSIS — Y9389 Activity, other specified: Secondary | ICD-10-CM | POA: Diagnosis not present

## 2015-07-17 DIAGNOSIS — Z79899 Other long term (current) drug therapy: Secondary | ICD-10-CM | POA: Diagnosis not present

## 2015-07-17 DIAGNOSIS — R519 Headache, unspecified: Secondary | ICD-10-CM

## 2015-07-17 DIAGNOSIS — Z792 Long term (current) use of antibiotics: Secondary | ICD-10-CM | POA: Diagnosis not present

## 2015-07-17 DIAGNOSIS — Y92410 Unspecified street and highway as the place of occurrence of the external cause: Secondary | ICD-10-CM | POA: Insufficient documentation

## 2015-07-17 DIAGNOSIS — Y998 Other external cause status: Secondary | ICD-10-CM | POA: Diagnosis not present

## 2015-07-17 DIAGNOSIS — R51 Headache: Secondary | ICD-10-CM

## 2015-07-17 DIAGNOSIS — J45909 Unspecified asthma, uncomplicated: Secondary | ICD-10-CM | POA: Insufficient documentation

## 2015-07-17 DIAGNOSIS — S0990XA Unspecified injury of head, initial encounter: Secondary | ICD-10-CM | POA: Diagnosis not present

## 2015-07-17 DIAGNOSIS — Z3202 Encounter for pregnancy test, result negative: Secondary | ICD-10-CM | POA: Insufficient documentation

## 2015-07-17 LAB — POC URINE PREG, ED: Preg Test, Ur: NEGATIVE

## 2015-07-17 MED ORDER — ACETAMINOPHEN 325 MG PO TABS
325.0000 mg | ORAL_TABLET | Freq: Once | ORAL | Status: AC
  Administered 2015-07-17: 325 mg via ORAL
  Filled 2015-07-17: qty 1

## 2015-07-17 MED ORDER — ONDANSETRON 4 MG PO TBDP
8.0000 mg | ORAL_TABLET | Freq: Once | ORAL | Status: AC
  Administered 2015-07-17: 8 mg via ORAL
  Filled 2015-07-17 (×2): qty 2

## 2015-07-17 MED ORDER — IBUPROFEN 400 MG PO TABS
400.0000 mg | ORAL_TABLET | Freq: Once | ORAL | Status: AC
  Administered 2015-07-17: 400 mg via ORAL
  Filled 2015-07-17: qty 1
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
## 2015-07-17 NOTE — ED Notes (Signed)
Pt left via w/c after all question answered. Pt stabler and verbalizes understanding of instructyions . Departs with family,

## 2016-02-07 ENCOUNTER — Emergency Department (HOSPITAL_COMMUNITY)
Admission: EM | Admit: 2016-02-07 | Discharge: 2016-02-07 | Disposition: A | Discharge status: Home / Self Care | Payer: BLUE CROSS/BLUE SHIELD | Attending: Emergency Medicine | Admitting: Emergency Medicine

## 2016-02-07 ENCOUNTER — Emergency Department (HOSPITAL_COMMUNITY): Payer: BLUE CROSS/BLUE SHIELD

## 2016-02-07 ENCOUNTER — Encounter (HOSPITAL_COMMUNITY): Payer: Self-pay | Admitting: Emergency Medicine

## 2016-02-07 DIAGNOSIS — R1084 Generalized abdominal pain: Secondary | ICD-10-CM | POA: Diagnosis not present

## 2016-02-07 DIAGNOSIS — J45909 Unspecified asthma, uncomplicated: Secondary | ICD-10-CM | POA: Insufficient documentation

## 2016-02-07 DIAGNOSIS — R112 Nausea with vomiting, unspecified: Secondary | ICD-10-CM | POA: Diagnosis not present

## 2016-02-07 DIAGNOSIS — R634 Abnormal weight loss: Secondary | ICD-10-CM | POA: Diagnosis not present

## 2016-02-07 DIAGNOSIS — R109 Unspecified abdominal pain: Secondary | ICD-10-CM | POA: Diagnosis present

## 2016-02-07 LAB — URINE MICROSCOPIC-ADD ON

## 2016-02-07 LAB — COMPREHENSIVE METABOLIC PANEL
ALBUMIN: 3.4 g/dL — AB (ref 3.5–5.0)
ALT: 12 U/L — ABNORMAL LOW (ref 14–54)
ANION GAP: 7 (ref 5–15)
AST: 19 U/L (ref 15–41)
Alkaline Phosphatase: 52 U/L (ref 38–126)
BILIRUBIN TOTAL: 0.3 mg/dL (ref 0.3–1.2)
BUN: 9 mg/dL (ref 6–20)
CO2: 23 mmol/L (ref 22–32)
Calcium: 9.4 mg/dL (ref 8.9–10.3)
Chloride: 106 mmol/L (ref 101–111)
Creatinine, Ser: 0.66 mg/dL (ref 0.44–1.00)
GLUCOSE: 100 mg/dL — AB (ref 65–99)
POTASSIUM: 4.3 mmol/L (ref 3.5–5.1)
Sodium: 136 mmol/L (ref 135–145)
TOTAL PROTEIN: 6.9 g/dL (ref 6.5–8.1)

## 2016-02-07 LAB — CBC
HEMATOCRIT: 36.7 % (ref 36.0–46.0)
HEMOGLOBIN: 12 g/dL (ref 12.0–15.0)
MCH: 27.5 pg (ref 26.0–34.0)
MCHC: 32.7 g/dL (ref 30.0–36.0)
MCV: 84.2 fL (ref 78.0–100.0)
Platelets: 285 10*3/uL (ref 150–400)
RBC: 4.36 MIL/uL (ref 3.87–5.11)
RDW: 13.3 % (ref 11.5–15.5)
WBC: 7.2 10*3/uL (ref 4.0–10.5)

## 2016-02-07 LAB — LIPASE, BLOOD: Lipase: 22 U/L (ref 11–51)

## 2016-02-07 LAB — URINALYSIS, ROUTINE W REFLEX MICROSCOPIC
Bilirubin Urine: NEGATIVE
Glucose, UA: NEGATIVE mg/dL
Hgb urine dipstick: NEGATIVE
Ketones, ur: NEGATIVE mg/dL
NITRITE: NEGATIVE
PH: 6.5 (ref 5.0–8.0)
Protein, ur: NEGATIVE mg/dL
SPECIFIC GRAVITY, URINE: 1.024 (ref 1.005–1.030)

## 2016-02-07 LAB — POC URINE PREG, ED: PREG TEST UR: NEGATIVE

## 2016-02-07 LAB — HIV ANTIBODY (ROUTINE TESTING W REFLEX): HIV SCREEN 4TH GENERATION: NONREACTIVE

## 2016-02-07 MED ORDER — PROCHLORPERAZINE EDISYLATE 5 MG/ML IJ SOLN
10.0000 mg | Freq: Once | INTRAMUSCULAR | Status: AC
Start: 2016-02-07 — End: 2016-02-07
  Administered 2016-02-07: 10 mg via INTRAVENOUS
  Filled 2016-02-07: qty 2

## 2016-02-07 MED ORDER — ONDANSETRON 4 MG PO TBDP: 4.0000 mg | ORAL_TABLET | Freq: Three times a day (TID) | ORAL | Status: DC | PRN

## 2016-02-07 MED ORDER — OMEPRAZOLE 20 MG PO CPDR: 20.0000 mg | DELAYED_RELEASE_CAPSULE | Freq: Every day | ORAL | Status: DC

## 2016-02-07 MED ORDER — IOPAMIDOL (ISOVUE-300) INJECTION 61%
INTRAVENOUS | Status: AC
  Administered 2016-02-07: 100 mL
  Filled 2016-02-07: qty 100

## 2016-02-07 MED ORDER — SODIUM CHLORIDE 0.9 % IV BOLUS (SEPSIS)
2000.0000 mL | Freq: Once | INTRAVENOUS | Status: AC
  Administered 2016-02-07: 2000 mL via INTRAVENOUS

## 2016-02-07 MED ORDER — HYDROCODONE-ACETAMINOPHEN 5-325 MG PO TABS: 1.0000 | ORAL_TABLET | Freq: Four times a day (QID) | ORAL | Status: DC | PRN

## 2016-02-07 MED ORDER — FAMOTIDINE IN NACL 20-0.9 MG/50ML-% IV SOLN
20.0000 mg | Freq: Once | INTRAVENOUS | Status: AC
  Administered 2016-02-07: 20 mg via INTRAVENOUS
  Filled 2016-02-07: qty 50

## 2016-02-07 NOTE — ED Provider Notes (Signed)
Lauren Thornton 03-Jul-1991  Pt here for abdominal pain x 2 weeks, given to me at shift change with CT abd/pelvis pending, initial eval and work up done by [employer], PA-C.  Results for orders placed or performed during the hospital encounter of 02/07/16  Lipase, blood  Result Value Ref Range   Lipase 22 11 - 51 U/L  Comprehensive metabolic panel  Result Value Ref Range   Sodium 136 135 - 145 mmol/L   Potassium 4.3 3.5 - 5.1 mmol/L   Chloride 106 101 - 111 mmol/L   CO2 23 22 - 32 mmol/L   Glucose, Bld 100 (H) 65 - 99 mg/dL   BUN 9 6 - 20 mg/dL   Creatinine, Ser 0.66 0.44 - 1.00 mg/dL   Calcium 9.4 8.9 - 10.3 mg/dL   Total Protein 6.9 6.5 - 8.1 g/dL   Albumin 3.4 (L) 3.5 - 5.0 g/dL   AST 19 15 - 41 U/L   ALT 12 (L) 14 - 54 U/L   Alkaline Phosphatase 52 38 - 126 U/L   Total Bilirubin 0.3 0.3 - 1.2 mg/dL   GFR calc non Af Amer >60 >60 mL/min   GFR calc Af Amer >60 >60 mL/min   Anion gap 7 5 - 15  CBC  Result Value Ref Range   WBC 7.2 4.0 - 10.5 K/uL   RBC 4.36 3.87 - 5.11 MIL/uL   Hemoglobin 12.0 12.0 - 15.0 g/dL   HCT 36.7 36.0 - 46.0 %   MCV 84.2 78.0 - 100.0 fL   MCH 27.5 26.0 - 34.0 pg   MCHC 32.7 30.0 - 36.0 g/dL   RDW 13.3 11.5 - 15.5 %   Platelets 285 150 - 400 K/uL  Urinalysis, Routine w reflex microscopic  Result Value Ref Range   Color, Urine YELLOW YELLOW   APPearance CLEAR CLEAR   Specific Gravity, Urine 1.024 1.005 - 1.030   pH 6.5 5.0 - 8.0   Glucose, UA NEGATIVE NEGATIVE mg/dL   Hgb urine dipstick NEGATIVE NEGATIVE   Bilirubin Urine NEGATIVE NEGATIVE   Ketones, ur NEGATIVE NEGATIVE mg/dL   Protein, ur NEGATIVE NEGATIVE mg/dL   Nitrite NEGATIVE NEGATIVE   Leukocytes, UA TRACE (A) NEGATIVE  Urine microscopic-add on  Result Value Ref Range   Squamous Epithelial / LPF 6-30 (A) NONE SEEN   WBC, UA 0-5 0 - 5 WBC/hpf   RBC / HPF 0-5 0 - 5 RBC/hpf   Bacteria, UA RARE (A) NONE SEEN  POC urine preg, ED  Result Value Ref Range   Preg Test, Ur NEGATIVE  NEGATIVE   Ct Abdomen Pelvis W Contrast  02/07/2016  CLINICAL DATA:  Mid abdominal pain with vomiting, diarrhea, headache, chills, poor appetite for 2 weeks. Weight loss. EXAM: CT ABDOMEN AND PELVIS WITH CONTRAST TECHNIQUE: Multidetector CT imaging of the abdomen and pelvis was performed using the standard protocol following bolus administration of intravenous contrast. CONTRAST:  122mL ISOVUE-300 IOPAMIDOL (ISOVUE-300) INJECTION 61% COMPARISON:  11/09/2011 FINDINGS: Lung bases are clear. Hyperdense circumscribed lesions in the breasts likely representing hemorrhagic cysts. No spiculation. These are likely benign. The liver, spleen, gallbladder, pancreas, adrenal glands, kidneys, abdominal aorta, inferior vena cava, and retroperitoneal lymph nodes are unremarkable. Stomach, small bowel, and colon are not abnormally distended. No free air or free fluid in the abdomen. Abdominal wall musculature appears intact. Pelvis: Appendix is normal. Uterus and ovaries are not enlarged. Bladder wall is not thickened. No free or loculated pelvic fluid collections. No pelvic mass or  lymphadenopathy. Mild lumbar curvature possibly positional. No destructive bone lesions. IMPRESSION: No acute process demonstrated in the abdomen or pelvis. No evidence of bowel obstruction. Electronically Signed   By: Lucienne Capers M.D.   On: 02/07/2016 06:50   Lab work was unremarkable, urinalysis pertinent for trace leukocytes, rare bacteria, no urinary complaints, no flank pain, on exam no CVA tenderness or suprapubic tenderness, do not suspect and will not treat for UTI/pyelo  CT of the abdomen and pelvis is negative for acute pathology. Reviewed results with the patient and with her mother who is at bedside. Will initiate PPI trial, give outpatient GI follow-up.  Return precautions reviewed, patient given omeprazole, Zofran and a few norco, encouraged to use only with severe abdominal pain, to avoid GI SE including constipation.  HIV  lab pending, encouraged to follow up on My Chart.   Patient was discharged in good condition with stable vital signs.  Filed Vitals:   02/07/16 0500 02/07/16 0515 02/07/16 0715 02/07/16 0800  BP: 118/73 105/65 113/73 110/70  Pulse: 85 77 88 82  Temp:      TempSrc:      Resp:   16 16  Height:      Weight:      SpO2: 100% 99% 100% 100%      Delsa Grana, PA-C 02/07/16 [id number]  Merryl Hacker, MD 02/07/16 1016

## 2016-02-07 NOTE — Discharge Instructions (Signed)

## 2016-02-07 NOTE — ED Notes (Signed)
Pt verbalized understanding of d/c instructions, prescriptions, and follow-up care. No further questions/concerns, VSS, ambulatory w/ steady gait (refused wheelchair) 

## 2016-02-07 NOTE — ED Notes (Signed)
Pt. reports mid abdominal pain with emesis , diarrhea, headache , chills and poor appetite onset 2 weeks ago .

## 2016-02-07 NOTE — ED Provider Notes (Signed)
CSN: [id number]     Arrival date & time 02/07/16  0021 History   First MD Initiated Contact with Patient 02/07/16 0334     Chief Complaint  Patient presents with  . Abdominal Pain     (Consider location/radiation/quality/duration/timing/severity/associated sxs/prior Treatment) The history is provided by the patient and medical records. No language interpreter was used.     Lauren Thornton is a 25 y.o. female  with a hx of asthma presents to the Emergency Department complaining of gradual, persistent, progressively worsening centralized abd pain onset 2 weeks ago. Associated symptoms include nausea, vomiting and diarrhea.  She reports 8-10lb unintentional weight loss in the last 2 weeks.  Pt reports all ssx occur within 10-15 min of eating.  She reports decreased PO intake due to this.  She reports pain resolves if she doesn't eat.  Pt reports it doesn't matter what she eats.  Pt also reports associated, generalized headache for several days.  Pt denies fever, chills, headache, neck pain, chest pain, syncope, dysuria.  Pt denies recent travel, sick contacts, tick bites.  No hx of abd surgeries.  LMP: last week   Past Medical History  Diagnosis Date  . Asthma    Past Surgical History  Procedure Laterality Date  . Foot surgery Right    No family history on file. Social History  Substance Use Topics  . Smoking status: Never Smoker   . Smokeless tobacco: None  . Alcohol Use: No   OB History    No data available     Review of Systems  Constitutional: Positive for appetite change and unexpected weight change. Negative for fever, diaphoresis and fatigue.  HENT: Negative for mouth sores.   Eyes: Negative for visual disturbance.  Respiratory: Negative for cough, chest tightness, shortness of breath and wheezing.   Cardiovascular: Negative for chest pain.  Gastrointestinal: Positive for nausea, vomiting, abdominal pain and diarrhea. Negative for constipation.  Endocrine: Negative for  polydipsia, polyphagia and polyuria.  Genitourinary: Negative for dysuria, urgency, frequency and hematuria.  Musculoskeletal: Negative for back pain and neck stiffness.  Skin: Negative for rash.  Allergic/Immunologic: Negative for immunocompromised state.  Neurological: Positive for light-headedness and headaches. Negative for syncope.  Hematological: Does not bruise/bleed easily.  Psychiatric/Behavioral: Negative for sleep disturbance. The patient is not nervous/anxious.       Allergies  Review of patient's allergies indicates no known allergies.  Home Medications   Prior to Admission medications   Medication Sig Start Date End Date Taking? Authorizing Provider  albuterol (PROVENTIL HFA;VENTOLIN HFA) 108 (90 BASE) MCG/ACT inhaler Inhale 2 puffs into the lungs every 6 (six) hours as needed. For shortness of breath    Historical Provider, MD  albuterol (PROVENTIL) (2.5 MG/3ML) 0.083% nebulizer solution Take 2.5 mg by nebulization every 6 (six) hours as needed for wheezing or shortness of breath.     Historical Provider, MD  ciprofloxacin (CIPRO) 500 MG tablet Take 500 mg by mouth every 12 (twelve) hours. 02/15/14   Historical Provider, MD  ondansetron (ZOFRAN-ODT) 8 MG disintegrating tablet Take 8 mg by mouth every 8 (eight) hours. 02/15/14   Historical Provider, MD   BP 105/65 mmHg  Pulse 77  Temp(Src) 98.7 F (37.1 C) (Oral)  Resp 14  Ht 5\' 1"  (1.549 m)  Wt 60.81 kg  BMI 25.34 kg/m2  SpO2 99%  LMP 01/31/2016 (Approximate) Physical Exam  Constitutional: She appears well-developed and well-nourished. No distress.  Awake, alert, nontoxic appearance  HENT:  Head: Normocephalic and atraumatic.  Mouth/Throat: Oropharynx is clear and moist. No oropharyngeal exudate.  Eyes: Conjunctivae are normal. No scleral icterus.  Neck: Normal range of motion. Neck supple.  Cardiovascular: Normal rate, regular rhythm, normal heart sounds and intact distal pulses.   No murmur  heard. Pulmonary/Chest: Effort normal and breath sounds normal. No respiratory distress. She has no wheezes.  Equal chest expansion  Abdominal: Soft. Bowel sounds are normal. She exhibits no mass. There is tenderness in the right lower quadrant and periumbilical area. There is no rebound, no guarding and no CVA tenderness.  Musculoskeletal: Normal range of motion. She exhibits no edema.  Neurological: She is alert.  Speech is clear and goal oriented Moves extremities without ataxia  Skin: Skin is warm and dry. She is not diaphoretic.  Healing rash to the left breast; no secondary infection.    Psychiatric: She has a normal mood and affect.  Nursing note and vitals reviewed.   ED Course  Procedures (including critical care time) Labs Review Labs Reviewed  COMPREHENSIVE METABOLIC PANEL - Abnormal; Notable for the following:    Glucose, Bld 100 (*)    Albumin 3.4 (*)    ALT 12 (*)    All other components within normal limits  URINALYSIS, ROUTINE W REFLEX MICROSCOPIC (NOT AT [facility]) - Abnormal; Notable for the following:    Leukocytes, UA TRACE (*)    All other components within normal limits  URINE MICROSCOPIC-ADD ON - Abnormal; Notable for the following:    Squamous Epithelial / LPF 6-30 (*)    Bacteria, UA RARE (*)    All other components within normal limits  LIPASE, BLOOD  CBC  HIV ANTIBODY (ROUTINE TESTING)  POC URINE PREG, ED     MDM   Final diagnoses:  Weight loss  Generalized abdominal pain  Non-intractable vomiting with nausea, vomiting of unspecified type   Bernadene Bell presents with abd pain, N/V/D after every PO intake.  Also with weight loss.  Recent shingles episode.  Question possible immunocompromise.  No hx of same.  HIV pending.  Labs reassuring.  No evidence of UTI.  Denies sexual activity or vaginal ssx.    6:45 AM At shift change, care transferred to Delsa Grana, PA-C who will follow labs, CT, reassess and determine disposition.      Jarrett Soho  Uziel Covault, PA-C 02/07/16 [id number]  Merryl Hacker, MD 02/07/16 1016

## 2016-02-14 ENCOUNTER — Encounter: Payer: Self-pay | Admitting: Gastroenterology

## 2016-03-15 ENCOUNTER — Encounter (HOSPITAL_COMMUNITY): Payer: Self-pay | Admitting: *Deleted

## 2016-03-15 ENCOUNTER — Emergency Department (HOSPITAL_COMMUNITY)
Admission: EM | Admit: 2016-03-15 | Discharge: 2016-03-15 | Disposition: A | Discharge status: Home / Self Care | Payer: BLUE CROSS/BLUE SHIELD | Attending: Emergency Medicine | Admitting: Emergency Medicine

## 2016-03-15 DIAGNOSIS — H578 Other specified disorders of eye and adnexa: Secondary | ICD-10-CM | POA: Insufficient documentation

## 2016-03-15 DIAGNOSIS — J45909 Unspecified asthma, uncomplicated: Secondary | ICD-10-CM | POA: Insufficient documentation

## 2016-03-15 DIAGNOSIS — H5711 Ocular pain, right eye: Secondary | ICD-10-CM | POA: Diagnosis present

## 2016-03-15 DIAGNOSIS — Z79899 Other long term (current) drug therapy: Secondary | ICD-10-CM | POA: Insufficient documentation

## 2016-03-15 DIAGNOSIS — H5789 Other specified disorders of eye and adnexa: Secondary | ICD-10-CM

## 2016-03-15 MED ORDER — DIPHENHYDRAMINE HCL 25 MG PO CAPS
25.0000 mg | ORAL_CAPSULE | Freq: Once | ORAL | Status: AC
  Administered 2016-03-15: 25 mg via ORAL
  Filled 2016-03-15: qty 1

## 2016-03-15 NOTE — ED Notes (Signed)
PT reports eye pain started yesterday. Pt reports using a new skin meds.

## 2016-03-15 NOTE — ED Provider Notes (Signed)
CSN: [id number]     Arrival date & time 03/15/16  1144 History  By signing my name below, I, [name], attest that this documentation has been prepared under the direction and in the presence of non-physician practitioner, [name], PA-C. Electronically Signed: [name], Scribe. 03/15/2016. 12:13 PM.   Chief Complaint  Patient presents with  . Eye Pain    The history is provided by the patient. No language interpreter was used.   HPI Comments:  Lauren Thornton is a 25 y.o. female who presents to the Emergency Department complaining of moderate swelling to the right eye which she woke up with this AM. Pt states she has been using a newly prescribed acne cream for ~ 3 days; states the cream did not enter her eye. She states the swelling has mildly improved since onset. Pt notes intermittent 4/10 stinging pain in the right eye. No benadryl taken PTA.  No alleviating factors noted. No SOB, trouble swallowing, or rash. No other new soaps, detergents, or other personal care products.  No known allergies.  Denies changes in vision.  VSS.  Past Medical History  Diagnosis Date  . Asthma    Past Surgical History  Procedure Laterality Date  . Foot surgery Right    History reviewed. No pertinent family history. Social History  Substance Use Topics  . Smoking status: Never Smoker   . Smokeless tobacco: None  . Alcohol Use: No   OB History    No data available     Review of Systems  Constitutional: Negative for fever.  HENT: Positive for facial swelling. Negative for trouble swallowing.   Eyes: Positive for pain.  Respiratory: Negative for shortness of breath.   Skin: Negative for rash.  All other systems reviewed and are negative.  Allergies  Review of patient's allergies indicates no known allergies.  Home Medications   Prior to Admission medications   Medication Sig Start Date End Date Taking? Authorizing Provider  albuterol (PROVENTIL HFA;VENTOLIN HFA) 108 (90 BASE)  MCG/ACT inhaler Inhale 2 puffs into the lungs every 6 (six) hours as needed. For shortness of breath    Historical Provider, MD  albuterol (PROVENTIL) (2.5 MG/3ML) 0.083% nebulizer solution Take 2.5 mg by nebulization every 6 (six) hours as needed for wheezing or shortness of breath.     Historical Provider, MD  HYDROcodone-acetaminophen (NORCO/VICODIN) 5-325 MG tablet Take 1-2 tablets by mouth every 6 (six) hours as needed for severe pain. 02/07/16   Delsa Grana, PA-C  Norgestimate-Ethinyl Estradiol Triphasic (ORTHO TRI-CYCLEN, 28,) 0.18/0.215/0.25 MG-35 MCG tablet Take 1 tablet by mouth daily.    Historical Provider, MD  omeprazole (PRILOSEC) 20 MG capsule Take 1 capsule (20 mg total) by mouth daily. 02/07/16   Delsa Grana, PA-C  ondansetron (ZOFRAN ODT) 4 MG disintegrating tablet Take 1 tablet (4 mg total) by mouth every 8 (eight) hours as needed for nausea or vomiting. 02/07/16   Delsa Grana, PA-C   BP 125/77 mmHg  Pulse 79  Resp 20  SpO2 99%   Physical Exam  Constitutional: She is oriented to person, place, and time. She appears well-developed and well-nourished.  HENT:  Head: Normocephalic and atraumatic.  Mouth/Throat: Oropharynx is clear and moist.  Scattered acne noted to face  Eyes: Conjunctivae and EOM are normal. Pupils are equal, round, and reactive to light.  Mild swelling noted to right peri-orbital region without erythema; EOMs fully intact and non-painful; no conjunctiva erythema or drainage; no observed FB  Neck: Normal range of motion.  Cardiovascular: Normal rate, regular rhythm and normal heart sounds.   Pulmonary/Chest: Effort normal and breath sounds normal.  Abdominal: Soft. Bowel sounds are normal.  Musculoskeletal: Normal range of motion.  Neurological: She is alert and oriented to person, place, and time.  Skin: Skin is warm and dry. No rash noted.  Psychiatric: She has a normal mood and affect.  Nursing note and vitals reviewed.   ED Course  Procedures    DIAGNOSTIC STUDIES:  Oxygen Saturation is 99% on RA, normal by my interpretation.    COORDINATION OF CARE:  12:10 PM Advised pt to cease use of face cream and to begin taking benadryl. Discussed treatment plan with pt at bedside and pt agreed to plan.    MDM   Final diagnoses:  Eye swelling, right   25 y.o. F here with right periorbital swelling after using new acne cream from PCP.  Likely allergic type reaction.  No other new products.  No signs of orbital or pre-septal cellulitis.  No conjunctival erythema or drainage.  EOMs fully intact and non-painful.  Denies visual change.  Recommended to discontinue use of cream. Start benadryl PRN.  FU with PCP.  Discussed plan with patient, he/she acknowledged understanding and agreed with plan of care.  Return precautions given for new or worsening symptoms.  I personally performed the services described in this documentation, which was scribed in my presence. The recorded information has been reviewed and is accurate.  Larene Pickett, PA-C 03/15/16 [place], MD 03/16/16 [phone]

## 2016-03-15 NOTE — ED Notes (Signed)
Declined W/C at D/C and was escorted to lobby by RN. 

## 2016-03-15 NOTE — Discharge Instructions (Signed)
Recommend to continue benadryl at home.  Recommend to stop using the topical medication. Follow-up with your primary care doctor.

## 2016-03-23 ENCOUNTER — Other Ambulatory Visit: Payer: Self-pay | Admitting: Obstetrics and Gynecology

## 2016-03-23 DIAGNOSIS — N6320 Unspecified lump in the left breast, unspecified quadrant: Secondary | ICD-10-CM

## 2016-03-29 ENCOUNTER — Other Ambulatory Visit: Payer: BLUE CROSS/BLUE SHIELD

## 2016-04-17 ENCOUNTER — Ambulatory Visit: Payer: BLUE CROSS/BLUE SHIELD | Admitting: Gastroenterology

## 2016-04-27 ENCOUNTER — Other Ambulatory Visit: Payer: BLUE CROSS/BLUE SHIELD

## 2016-05-24 ENCOUNTER — Other Ambulatory Visit: Payer: BLUE CROSS/BLUE SHIELD

## 2016-05-31 ENCOUNTER — Ambulatory Visit
Admission: RE | Admit: 2016-05-31 | Discharge: 2016-05-31 | Disposition: A | Discharge status: Home / Self Care | Payer: BLUE CROSS/BLUE SHIELD | Source: Ambulatory Visit | Attending: Obstetrics and Gynecology | Admitting: Obstetrics and Gynecology

## 2016-05-31 DIAGNOSIS — N6320 Unspecified lump in the left breast, unspecified quadrant: Secondary | ICD-10-CM

## 2017-04-09 ENCOUNTER — Encounter: Payer: Self-pay | Admitting: Obstetrics & Gynecology

## 2017-04-09 ENCOUNTER — Ambulatory Visit (INDEPENDENT_AMBULATORY_CARE_PROVIDER_SITE_OTHER): Payer: BLUE CROSS/BLUE SHIELD

## 2017-04-09 DIAGNOSIS — Z3201 Encounter for pregnancy test, result positive: Secondary | ICD-10-CM

## 2017-04-09 LAB — POCT PREGNANCY, URINE: Preg Test, Ur: POSITIVE — AB

## 2017-04-09 NOTE — Progress Notes (Signed)
Pt here today for pregnancy test.  Resulted positive.  Pt reports LMP 03/07/17.  EDD 12/12/17.  [place] office to provide proof of pregnancy letter to start prenatal care.  Pt stated understanding with no further questions.

## 2017-04-22 ENCOUNTER — Inpatient Hospital Stay (HOSPITAL_COMMUNITY): Payer: BLUE CROSS/BLUE SHIELD

## 2017-04-22 ENCOUNTER — Encounter (HOSPITAL_COMMUNITY): Payer: Self-pay | Admitting: *Deleted

## 2017-04-22 ENCOUNTER — Inpatient Hospital Stay (HOSPITAL_COMMUNITY)
Admission: AD | Admit: 2017-04-22 | Discharge: 2017-04-22 | Discharge status: Against Medical Advice | Payer: BLUE CROSS/BLUE SHIELD | Source: Ambulatory Visit | Attending: Obstetrics and Gynecology | Admitting: Obstetrics and Gynecology

## 2017-04-22 DIAGNOSIS — Z3A01 Less than 8 weeks gestation of pregnancy: Secondary | ICD-10-CM | POA: Insufficient documentation

## 2017-04-22 DIAGNOSIS — Z3201 Encounter for pregnancy test, result positive: Secondary | ICD-10-CM | POA: Insufficient documentation

## 2017-04-22 DIAGNOSIS — Z5321 Procedure and treatment not carried out due to patient leaving prior to being seen by health care provider: Secondary | ICD-10-CM | POA: Insufficient documentation

## 2017-04-22 DIAGNOSIS — O209 Hemorrhage in early pregnancy, unspecified: Secondary | ICD-10-CM

## 2017-04-22 DIAGNOSIS — O26851 Spotting complicating pregnancy, first trimester: Secondary | ICD-10-CM | POA: Diagnosis present

## 2017-04-22 LAB — URINALYSIS, ROUTINE W REFLEX MICROSCOPIC
Bilirubin Urine: NEGATIVE
Glucose, UA: NEGATIVE mg/dL
Hgb urine dipstick: NEGATIVE
Ketones, ur: NEGATIVE mg/dL
Leukocytes, UA: NEGATIVE
Nitrite: NEGATIVE
Protein, ur: NEGATIVE mg/dL
Specific Gravity, Urine: 1.025 (ref 1.005–1.030)
pH: 6 (ref 5.0–8.0)

## 2017-04-22 LAB — WET PREP, GENITAL
Clue Cells Wet Prep HPF POC: NONE SEEN
Sperm: NONE SEEN
Trich, Wet Prep: NONE SEEN
Yeast Wet Prep HPF POC: NONE SEEN

## 2017-04-22 LAB — HCG, QUANTITATIVE, PREGNANCY: hCG, Beta Chain, Quant, S: 27050 m[IU]/mL — ABNORMAL HIGH (ref <5)

## 2017-04-22 NOTE — MAU Provider Note (Signed)
History     CSN: [id number]  Arrival date & time 04/22/17  1634   None     Chief Complaint  Patient presents with  . Vaginal Bleeding    26 y.o. G1P0 [redacted]w[redacted]d presents to MAU stating that she had some pink to red spotting earlier today at 300pm. She denies having intercourse.    Past Medical History:  Diagnosis Date  . Asthma     Past Surgical History:  Procedure Laterality Date  . FOOT SURGERY Right     Family History  Problem Relation Age of Onset  . Asthma Mother   . Hypertension Mother   . Hypertension Father     Social History  Substance Use Topics  . Smoking status: Never Smoker  . Smokeless tobacco: Never Used  . Alcohol use No    OB History    Gravida Para Term Preterm AB Living   1             SAB TAB Ectopic Multiple Live Births                  Review of Systems  Genitourinary: Positive for vaginal bleeding.  All other systems reviewed and are negative.   Allergies  Patient has no known allergies.  Home Medications    BP 114/62   Pulse 95   Temp 98.1 F (36.7 C) (Oral)   Resp 16   LMP 03/07/2017   Physical Exam  Constitutional: She is oriented to person, place, and time. She appears well-developed and well-nourished.  HENT:  Head: Normocephalic and atraumatic.  Neck: Normal range of motion.  Cardiovascular: Normal rate and regular rhythm.   Pulmonary/Chest: Effort normal. No respiratory distress.  Abdominal: Soft. Bowel sounds are normal.  Genitourinary: Vagina normal.  Genitourinary Comments: Small amount brown discharge coming from cervix  Musculoskeletal: Normal range of motion.  Neurological: She is alert and oriented to person, place, and time.  Skin: Skin is warm and dry.  Psychiatric: She has a normal mood and affect. Her behavior is normal. Thought content normal.  Nursing note and vitals reviewed.   MAU Course  Procedures (including critical care time)  Labs Reviewed  WET PREP, GENITAL - Abnormal; Notable for the  following:       Result Value   WBC, Wet Prep HPF POC MODERATE (*)    All other components within normal limits  HCG, QUANTITATIVE, PREGNANCY - Abnormal; Notable for the following:    hCG, Beta Chain, Quant, S 27,050 (*)    All other components within normal limits  URINALYSIS, ROUTINE W REFLEX MICROSCOPIC - Abnormal; Notable for the following:    APPearance HAZY (*)    All other components within normal limits   No results found.   1. Vaginal bleeding in pregnancy, first trimester    P:  US showed a viable IUP at 6 weeks.  Discussed results with pt.  Bleeding precautions discussed.  Pt discharged home and will follow up in office.   MDM  Sign out to Latrelle Dodrill CNM @ 700pm

## 2017-04-22 NOTE — MAU Provider Note (Signed)
Urine sent to lab 

## 2017-04-22 NOTE — Progress Notes (Addendum)
G1@ 6.[redacted] wksga. Here due to bleeding that started this afernoon around 3 pm. Pinkish red color. No bleeding now.   1710: Provider notified. Report status of pt given. Orders received to order U/S and Beta HCG.   1726: Lab notified for stat labs.   1737: Lab at bs  1755: provider at bs assessing pt. Speculum exam done and wet prep  2022: pt still in U/s. Provider in unit and wants to be notified once pt returns from U/S  2135: pt left unit without discharge instructions.

## 2017-04-22 NOTE — Discharge Instructions (Signed)
Vaginal Bleeding During Pregnancy, First Trimester °A small amount of bleeding (spotting) from the vagina is common in early pregnancy. Sometimes the bleeding is normal and is not a problem, and sometimes it is a sign of something serious. Be sure to tell your doctor about any bleeding from your vagina right away. °Follow these instructions at home: °· Watch your condition for any changes. °· Follow your doctor's instructions about how active you can be. °· If you are on bed rest: °? You may need to stay in bed and only get up to use the bathroom. °? You may be allowed to do some activities. °? If you need help, make plans for someone to help you. °· Write down: °? The number of pads you use each day. °? How often you change pads. °? How soaked (saturated) your pads are. °· Do not use tampons. °· Do not douche. °· Do not have sex or orgasms until your doctor says it is okay. °· If you pass any tissue from your vagina, save the tissue so you can show it to your doctor. °· Only take medicines as told by your doctor. °· Do not take aspirin because it can make you bleed. °· Keep all follow-up visits as told by your doctor. °Contact a doctor if: °· You bleed from your vagina. °· You have cramps. °· You have labor pains. °· You have a fever that does not go away after you take medicine. °Get help right away if: °· You have very bad cramps in your back or belly (abdomen). °· You pass large clots or tissue from your vagina. °· You bleed more. °· You feel light-headed or weak. °· You pass out (faint). °· You have chills. °· You are leaking fluid or have a gush of fluid from your vagina. °· You pass out while pooping (having a bowel movement). °This information is not intended to replace advice given to you by your health care provider. Make sure you discuss any questions you have with your health care provider. °Document Released: 12/29/2013 Document Revised: 01/20/2016 Document Reviewed: 04/21/2013 °Elsevier Interactive  Patient Education © 2018 Elsevier Inc. ° °

## 2017-05-16 LAB — OB RESULTS CONSOLE ANTIBODY SCREEN: Antibody Screen: NEGATIVE

## 2017-05-16 LAB — OB RESULTS CONSOLE GC/CHLAMYDIA
CHLAMYDIA, DNA PROBE: NEGATIVE
Gonorrhea: NEGATIVE

## 2017-05-16 LAB — OB RESULTS CONSOLE RUBELLA ANTIBODY, IGM: Rubella: IMMUNE

## 2017-05-16 LAB — OB RESULTS CONSOLE ABO/RH: RH TYPE: POSITIVE

## 2017-05-16 LAB — OB RESULTS CONSOLE HIV ANTIBODY (ROUTINE TESTING): HIV: NONREACTIVE

## 2017-05-16 LAB — OB RESULTS CONSOLE HEPATITIS B SURFACE ANTIGEN: Hepatitis B Surface Ag: NEGATIVE

## 2017-05-16 LAB — OB RESULTS CONSOLE RPR: RPR: NONREACTIVE

## 2017-06-08 NOTE — Telephone Encounter (Signed)
06/08/2017 Received fax referral from [facility] for upcoming appointment with Dr. Martinique on 06/12/2017 at 2:40 pm.  Records given to [facility].  cbr

## 2017-06-11 NOTE — Progress Notes (Signed)
Cardiology Office Note   Date:  06/12/2017   ID:  Shamya, Macfadden 12-06-1990, MRN [id number]  PCP:  Thurman Coyer, MD  Cardiologist:   Peter Martinique, MD   Chief Complaint  Patient presents with  . Follow-up    NP.  Marland Kitchen Palpitations  . Shortness of Breath      History of Present Illness: Lauren Thornton is a 26 y.o. female who is seen at the request of Dr. Garwin Brothers for evaluation of palpitations. She is now [redacted] weeks pregnant. She notes for 2-3 weeks that her heart has been beating faster. Notices this more when resting or going up stairs. No skipping. May last some minutes. No dizziness, syncope, chest pain or SOB. No history of murmur. She is very anxious about this being her first pregnancy. Does not drink caffeine regularly. No Etoh or tobacco use. Generally in excellent health.     Past Medical History:  Diagnosis Date  . Asthma     Past Surgical History:  Procedure Laterality Date  . FOOT SURGERY Right      Current Outpatient Prescriptions  Medication Sig Dispense Refill  . albuterol (PROVENTIL HFA;VENTOLIN HFA) 108 (90 BASE) MCG/ACT inhaler Inhale 2 puffs into the lungs every 6 (six) hours as needed. For shortness of breath    . albuterol (PROVENTIL) (2.5 MG/3ML) 0.083% nebulizer solution Take 2.5 mg by nebulization every 6 (six) hours as needed for wheezing or shortness of breath.      No current facility-administered medications for this visit.     Allergies:   Patient has no known allergies.    Social History:  The patient  reports that she has never smoked. She has never used smokeless tobacco. She reports that she does not drink alcohol or use drugs.   Family History:  The patient's family history includes Asthma in her mother; Hypertension in her father and mother.    ROS:  Please see the history of present illness.   Otherwise, review of systems are positive for none.   All other systems are reviewed and negative.    PHYSICAL EXAM: VS:  BP 110/60  Comment: Right arm.  Pulse 91   Ht 5\' 1"  (1.549 m)   Wt 148 lb (67.1 kg)   LMP 03/07/2017   BMI 27.96 kg/m  , BMI Body mass index is 27.96 kg/m. GEN: Well nourished, well developed, gravid female in no acute distress  HEENT: normal  Neck: no JVD, carotid bruits, or masses Cardiac: RRR; no murmurs, rubs, or gallops,no edema  Respiratory:  clear to auscultation bilaterally, normal work of breathing GI: soft, nontender, nondistended, + BS MS: no deformity or atrophy  Skin: warm and dry, no rash Neuro:  Strength and sensation are intact Psych: euthymic mood, full affect   EKG:  EKG is ordered today. The ekg ordered today demonstrates NSR rate 91. Normal Ecg. I have personally reviewed and interpreted this study.    Recent Labs: No results found for requested labs within last 8760 hours.    Lipid Panel No results found for: CHOL, TRIG, HDL, CHOLHDL, VLDL, LDLCALC, LDLDIRECT    Wt Readings from Last 3 Encounters:  06/12/17 148 lb (67.1 kg)  02/07/16 134 lb 1 oz (60.8 kg)  10/19/13 135 lb (61.2 kg)    Dated 02/07/16: Hgb 12. Chemistries normal.  TSH normal   Other studies Reviewed: Additional studies/ records that were reviewed today include: none. Review of the above records demonstrates: N/A   ASSESSMENT AND  PLAN:  1.  Symptoms of tachycardia. Her history is most consistent with sinus tachycardia. Some of this related to her pregnancy and exacerbated by anxiety. She has a normal Exam and Ecg. Labs OK. I have just reassured her concerning this. I don't think she needs any other cardiac evaluation. I have encouraged her to remain active and exercise during pregnancy. Needs to eat healthy. I will see as needed.   Current medicines are reviewed at length with the patient today.  The patient does not have concerns regarding medicines.  The following changes have been made:  no change  Labs/ tests ordered today include:   Orders Placed This Encounter  Procedures  . EKG  12-Lead     Disposition:   FU with me PRN  Signed, Peter Martinique, MD  06/12/2017 3:43 PM    [facility] [address], [place], [place], [address] Phone [phone], Fax [phone]

## 2017-06-12 ENCOUNTER — Encounter: Payer: Self-pay | Admitting: Cardiology

## 2017-06-12 ENCOUNTER — Telehealth: Payer: Self-pay | Admitting: Cardiology

## 2017-06-12 ENCOUNTER — Ambulatory Visit (INDEPENDENT_AMBULATORY_CARE_PROVIDER_SITE_OTHER): Payer: BLUE CROSS/BLUE SHIELD | Admitting: Cardiology

## 2017-06-12 VITALS — BP 110/60 | HR 91 | Ht 61.0 in | Wt 148.0 lb

## 2017-06-12 DIAGNOSIS — R000 Tachycardia, unspecified: Secondary | ICD-10-CM

## 2017-07-06 ENCOUNTER — Other Ambulatory Visit: Payer: Self-pay | Admitting: Obstetrics and Gynecology

## 2017-07-06 DIAGNOSIS — N63 Unspecified lump in unspecified breast: Secondary | ICD-10-CM

## 2017-07-11 ENCOUNTER — Ambulatory Visit
Admission: RE | Admit: 2017-07-11 | Discharge: 2017-07-11 | Disposition: A | Discharge status: Home / Self Care | Payer: BLUE CROSS/BLUE SHIELD | Source: Ambulatory Visit | Attending: Obstetrics and Gynecology | Admitting: Obstetrics and Gynecology

## 2017-07-11 ENCOUNTER — Other Ambulatory Visit: Payer: Self-pay | Admitting: Obstetrics and Gynecology

## 2017-07-11 DIAGNOSIS — N63 Unspecified lump in unspecified breast: Secondary | ICD-10-CM

## 2017-08-14 ENCOUNTER — Encounter (HOSPITAL_COMMUNITY): Payer: Self-pay

## 2017-08-14 ENCOUNTER — Other Ambulatory Visit: Payer: Self-pay

## 2017-08-14 ENCOUNTER — Inpatient Hospital Stay (HOSPITAL_COMMUNITY)
Admission: AD | Admit: 2017-08-14 | Discharge: 2017-08-14 | Disposition: A | Discharge status: Home / Self Care | Payer: BLUE CROSS/BLUE SHIELD | Source: Ambulatory Visit | Attending: Obstetrics and Gynecology | Admitting: Obstetrics and Gynecology

## 2017-08-14 DIAGNOSIS — M545 Low back pain, unspecified: Secondary | ICD-10-CM

## 2017-08-14 DIAGNOSIS — O99612 Diseases of the digestive system complicating pregnancy, second trimester: Secondary | ICD-10-CM | POA: Diagnosis not present

## 2017-08-14 DIAGNOSIS — R109 Unspecified abdominal pain: Secondary | ICD-10-CM | POA: Diagnosis present

## 2017-08-14 DIAGNOSIS — K219 Gastro-esophageal reflux disease without esophagitis: Secondary | ICD-10-CM | POA: Insufficient documentation

## 2017-08-14 DIAGNOSIS — O26892 Other specified pregnancy related conditions, second trimester: Secondary | ICD-10-CM | POA: Diagnosis not present

## 2017-08-14 DIAGNOSIS — Z3A22 22 weeks gestation of pregnancy: Secondary | ICD-10-CM | POA: Diagnosis not present

## 2017-08-14 DIAGNOSIS — M549 Dorsalgia, unspecified: Secondary | ICD-10-CM | POA: Insufficient documentation

## 2017-08-14 DIAGNOSIS — M25551 Pain in right hip: Secondary | ICD-10-CM | POA: Insufficient documentation

## 2017-08-14 LAB — URINALYSIS, ROUTINE W REFLEX MICROSCOPIC
BILIRUBIN URINE: NEGATIVE
Glucose, UA: NEGATIVE mg/dL
HGB URINE DIPSTICK: NEGATIVE
KETONES UR: NEGATIVE mg/dL
NITRITE: NEGATIVE
PH: 6 (ref 5.0–8.0)
Protein, ur: NEGATIVE mg/dL
SPECIFIC GRAVITY, URINE: 1.015 (ref 1.005–1.030)

## 2017-08-14 MED ORDER — CYCLOBENZAPRINE HCL 5 MG PO TABS: 5.0000 mg | ORAL_TABLET | Freq: Two times a day (BID) | ORAL | 0 refills | Status: DC | PRN

## 2017-08-14 MED ORDER — CYCLOBENZAPRINE HCL 5 MG PO TABS
5.0000 mg | ORAL_TABLET | Freq: Once | ORAL | Status: AC
  Administered 2017-08-14: 5 mg via ORAL
  Filled 2017-08-14: qty 1

## 2017-08-14 NOTE — Discharge Instructions (Signed)
Back Pain in Pregnancy Back pain during pregnancy is common. Back pain may be caused by several factors that are related to changes during your pregnancy. Follow these instructions at home: Managing pain, stiffness, and swelling  If directed, apply ice for sudden (acute) back pain. ? Put ice in a plastic bag. ? Place a towel between your skin and the bag. ? Leave the ice on for 20 minutes, 2-3 times per day.  If directed, apply heat to the affected area before you exercise: ? Place a towel between your skin and the heat pack or heating pad. ? Leave the heat on for 20-30 minutes. ? Remove the heat if your skin turns bright red. This is especially important if you are unable to feel pain, heat, or cold. You may have a greater risk of getting burned. Activity  Exercise as told by your health care provider. Exercising is the best way to prevent or manage back pain.  Listen to your body when lifting. If lifting hurts, ask for help or bend your knees. This uses your leg muscles instead of your back muscles.  Squat down when picking up something from the floor. Do not bend over.  Only use bed rest as told by your health care provider. Bed rest should only be used for the most severe episodes of back pain. Standing, Sitting, and Lying Down  Do not stand in one place for long periods of time.  Use good posture when sitting. Make sure your head rests over your shoulders and is not hanging forward. Use a pillow on your lower back if necessary.  Try sleeping on your side, preferably the left side, with a pillow or two between your legs. If you are sore after a night's rest, your bed may be too soft. A firm mattress may provide more support for your back during pregnancy. General instructions  Do not wear high heels.  Eat a healthy diet. Try to gain weight within your health care provider's recommendations.  Use a maternity girdle, elastic sling, or back brace as told by your health care  provider.  Take over-the-counter and prescription medicines only as told by your health care provider.  Keep all follow-up visits as told by your health care provider. This is important. This includes any visits with any specialists, such as a physical therapist. Contact a health care provider if:  Your back pain interferes with your daily activities.  You have increasing pain in other parts of your body. Get help right away if:  You develop numbness, tingling, weakness, or problems with the use of your arms or legs.  You develop severe back pain that is not controlled with medicine.  You have a sudden change in bowel or bladder control.  You develop shortness of breath, dizziness, or you faint.  You develop nausea, vomiting, or sweating.  You have back pain that is a rhythmic, cramping pain similar to labor pains. Labor pain is usually 1-2 minutes apart, lasts for about 1 minute, and involves a bearing down feeling or pressure in your pelvis.  You have back pain and your water breaks or you have vaginal bleeding.  You have back pain or numbness that travels down your leg.  Your back pain developed after you fell.  You develop pain on one side of your back.  You see blood in your urine.  You develop skin blisters in the area of your back pain. This information is not intended to replace advice given to you   by your health care provider. Make sure you discuss any questions you have with your health care provider. Document Released: 11/22/2005 Document Revised: 01/20/2016 Document Reviewed: 04/28/2015 Elsevier Interactive Patient Education  2018 Elsevier Inc.  

## 2017-08-14 NOTE — MAU Provider Note (Signed)
History     CSN: [id number]  Arrival date and time: 08/14/17 0905   None     Chief Complaint  Patient presents with  . Back Pain  . Abdominal Pain   HPI  Lauren Thornton is a 26yo G1P0 at 22+[redacted] weeks gestation presenting to MAU for right lower back/hip pain, and mid abdominal pain since yesterday.  She reports the right sided hip/low back pain is sharp and started yesterday, and gradually got worse with activity.  She denies that the pain radiates.  She reports taking 2 Tylenol PM last night without relief.  She states she is not currently working, and denies prolonged standing, heavy lifting, pushing, pulling, or injuring her back.  She reports it is difficult to walk and states getting up and down from the toilet is painful and she needed her husband to assist her.  She states she has not had anything to eat or drink since yesterday, and reported to the nurse that her pain was above the umbilicus.  She reports vomiting once in MAU, and states her abdominal pain is now gone.  She reports having some acid reflux with this pregnancy and has been taking Tums PRN.  She endorses good fetal movement.  She denies contractions, VB, LOF, abnormal discharge, dysuria, fevers, chills, CP, SOB, HA, edema, constipation, diarrhea.  She requests cervical exam today. She is here today with her husband and a female friend.  Prenatal records reviewed: has been followed by cardiology for rapid heart rate on exertion, no evidence of placenta previa on CUS, bilateral pyelectasis - with f/u sono nv.   OB History    Gravida Para Term Preterm AB Living   1             SAB TAB Ectopic Multiple Live Births                  Past Medical History:  Diagnosis Date  . Asthma     Past Surgical History:  Procedure Laterality Date  . FOOT SURGERY Right     Family History  Problem Relation Age of Onset  . Asthma Mother   . Hypertension Mother   . Hypertension Father     Social History   Tobacco Use  . Smoking  status: Never Smoker  . Smokeless tobacco: Never Used  Substance Use Topics  . Alcohol use: No  . Drug use: No    Allergies: No Known Allergies  Medications Prior to Admission  Medication Sig Dispense Refill Last Dose  . diphenhydramine-acetaminophen (TYLENOL PM) 25-500 MG TABS tablet Take 1 tablet by mouth at bedtime as needed.   08/13/2017 at Unknown time  . Prenatal Vit-Fe Fumarate-FA (PRENATAL MULTIVITAMIN) TABS tablet Take 1 tablet by mouth daily at 12 noon.   08/13/2017 at Unknown time  . albuterol (PROVENTIL HFA;VENTOLIN HFA) 108 (90 BASE) MCG/ACT inhaler Inhale 2 puffs into the lungs every 6 (six) hours as needed. For shortness of breath   emergency  . albuterol (PROVENTIL) (2.5 MG/3ML) 0.083% nebulizer solution Take 2.5 mg by nebulization every 6 (six) hours as needed for wheezing or shortness of breath.    emergency    Review of Systems  Constitutional: Negative.   HENT: Negative.   Respiratory: Negative.   Cardiovascular: Negative.   Gastrointestinal: Positive for nausea and vomiting.  Genitourinary: Negative.   Musculoskeletal: Positive for back pain.  Neurological: Negative.   Hematological: Negative.   Psychiatric/Behavioral: Negative.    Physical Exam   Blood pressure 119/64,  pulse 96, temperature 97.9 F (36.6 C), temperature source Oral, resp. rate 20, height 5\' 1"  (1.549 m), weight 73.1 kg (161 lb 4 oz), last menstrual period 03/07/2017, SpO2 100 %.  Physical Exam  Constitutional: She is oriented to person, place, and time. She appears well-developed and well-nourished.  HENT:  Head: Normocephalic.  Cardiovascular: Normal rate and regular rhythm.  Respiratory: Breath sounds normal.  GI: Soft. She exhibits no distension. There is no tenderness. There is no rebound.  Genitourinary:  Genitourinary Comments: Uterus: gravid, non-tender SVE: closed, thick  No CVAT bilaterally  Musculoskeletal: She exhibits tenderness.  Right lower hip tenderness between iliac  crest and gluteal fold  Neurological: She is alert and oriented to person, place, and time.  Skin: Skin is warm and dry.  Psychiatric: She has a normal mood and affect.  FHR: 142 bpm Toco: no contractions noted  Results for orders placed or performed during the hospital encounter of 08/14/17 (from the past 48 hour(s))  Urinalysis, Routine w reflex microscopic     Status: Abnormal   Collection Time: 08/14/17 10:45 AM  Result Value Ref Range   Color, Urine YELLOW YELLOW   APPearance CLEAR CLEAR   Specific Gravity, Urine 1.015 1.005 - 1.030   pH 6.0 5.0 - 8.0   Glucose, UA NEGATIVE NEGATIVE mg/dL   Hgb urine dipstick NEGATIVE NEGATIVE   Bilirubin Urine NEGATIVE NEGATIVE   Ketones, ur NEGATIVE NEGATIVE mg/dL   Protein, ur NEGATIVE NEGATIVE mg/dL   Nitrite NEGATIVE NEGATIVE   Leukocytes, UA TRACE (A) NEGATIVE   RBC / HPF 0-5 0 - 5 RBC/hpf   WBC, UA 0-5 0 - 5 WBC/hpf   Bacteria, UA RARE (A) NONE SEEN   Squamous Epithelial / LPF 0-5 (A) NONE SEEN   Mucus PRESENT    MAU Course  Procedures   Assessment and Plan  1. Single IUP at 22+6 weeks with right lower hip/back pain: Sciatic nerve pain vs. Sacroiliac joint pain      - UA/Ucx sent       - Flexeril 5mg  PO x 1 dose  2. Midabdominal pain/ Acid Reflux       - Vomited x 1 and per patient pain resolved      - Pt. Able to tolerate crackers and ginger ale prior   Reassessment:    - Pt. Reassured by exam and states she feels comfortable going home   - Discharge home   - Recommend heating pad 15-20 minutes to affected area; massage PRN of area   - Flexeril rx sent to pharmacy - advised pt. To call if no relief with Flexeril. Advised to avoid driving and do not take Tylenol PM with it.  Regular Tylenol 650mg  every 4 hrs PRN is okay.    - Recommend small frequent meals, drinking 8-10 glasses of water per day    - Recommended prenatal support band/belt/girdle; may need referral for Pelvic Floor PT if no better     - Recommend Zantac 150mg   daily for acid reflux in pregnancy     - Urine culture pending     - Preterm labor precautions and warning s/s reviewed; fetal movement discussed    - Advised pt. To call our office for further concerns     - Keep scheduled appt with Dr. Garwin Brothers in 1 week  Consult for plan of care: Dr. Jan Fireman Julien Berryman 08/14/2017, 10:47 AM

## 2017-08-14 NOTE — MAU Note (Signed)
Pt presents with c/o right lower back pain that began yesterday,worsens with activity, pain unrelieved with Tylenol.  Pt also reports intermittent  "discomfort" in upper abdomen that began this morning.  Denies VB.  Reports +FM.

## 2017-08-15 LAB — CULTURE, OB URINE: Culture: <10000 — AB

## 2017-08-28 NOTE — L&D Delivery Note (Addendum)
Delivery Note At 9:50 AM a viable and healthy female was delivered via Vaginal, Spontaneous (Presentation:vtx  ;OA  ).  APGAR: 8, 9; weight 6 lb 14.4 oz (3130 g).   Placenta status: spontaneous, intact not sent: , .  Cord:  with the following complications: CANx 1 reducible.  Cord pH: none  Anesthesia:  epidural Episiotomy: None Lacerations: 4th degree>2 cm length;Perineal; bilateral vaginal Sulcus Suture Repair: 3.0 chromic vicryl 4-0 vicryl Est. Blood Loss (mL): 400 Sphincter closed with O-vicryl x 4 , vaginal 2-0vicryl and 3-0 chromic, rectal mucosa 4-0 vicryl, remaining areas 3-0chromic. Rectal exam postprocedure intact Mom to postpartum.  Baby to Couplet care / Skin to Skin.  Hommer Cunliffe A Bernice Mullin 12/16/2017, 11:30 AM

## 2017-11-15 LAB — OB RESULTS CONSOLE GBS: STREP GROUP B AG: NEGATIVE

## 2017-12-13 ENCOUNTER — Other Ambulatory Visit: Payer: Self-pay | Admitting: Obstetrics and Gynecology

## 2017-12-13 ENCOUNTER — Encounter (HOSPITAL_COMMUNITY): Payer: Self-pay | Admitting: *Deleted

## 2017-12-13 ENCOUNTER — Telehealth (HOSPITAL_COMMUNITY): Payer: Self-pay | Admitting: *Deleted

## 2017-12-13 NOTE — Telephone Encounter (Signed)
Preadmission screen  

## 2017-12-15 ENCOUNTER — Inpatient Hospital Stay (HOSPITAL_COMMUNITY)
Admission: AD | Admit: 2017-12-15 | Discharge: 2017-12-18 | DRG: 768 | Disposition: A | Discharge status: Home / Self Care | Payer: BLUE CROSS/BLUE SHIELD | Source: Ambulatory Visit | Attending: Obstetrics and Gynecology | Admitting: Obstetrics and Gynecology

## 2017-12-15 ENCOUNTER — Inpatient Hospital Stay (HOSPITAL_COMMUNITY): Payer: BLUE CROSS/BLUE SHIELD | Admitting: Anesthesiology

## 2017-12-15 ENCOUNTER — Encounter (HOSPITAL_COMMUNITY): Payer: Self-pay

## 2017-12-15 DIAGNOSIS — Z3490 Encounter for supervision of normal pregnancy, unspecified, unspecified trimester: Secondary | ICD-10-CM | POA: Diagnosis present

## 2017-12-15 DIAGNOSIS — D62 Acute posthemorrhagic anemia: Secondary | ICD-10-CM | POA: Diagnosis not present

## 2017-12-15 DIAGNOSIS — O480 Post-term pregnancy: Principal | ICD-10-CM | POA: Diagnosis present

## 2017-12-15 DIAGNOSIS — O322XX0 Maternal care for transverse and oblique lie, not applicable or unspecified: Secondary | ICD-10-CM | POA: Diagnosis present

## 2017-12-15 DIAGNOSIS — O9081 Anemia of the puerperium: Secondary | ICD-10-CM | POA: Diagnosis not present

## 2017-12-15 DIAGNOSIS — Z3A40 40 weeks gestation of pregnancy: Secondary | ICD-10-CM

## 2017-12-15 LAB — TYPE AND SCREEN
ABO/RH(D): O POS
Antibody Screen: NEGATIVE

## 2017-12-15 LAB — ABO/RH: ABO/RH(D): O POS

## 2017-12-15 LAB — CBC
HEMATOCRIT: 31.8 % — AB (ref 36.0–46.0)
HEMOGLOBIN: 10.7 g/dL — AB (ref 12.0–15.0)
MCH: 27.4 pg (ref 26.0–34.0)
MCHC: 33.6 g/dL (ref 30.0–36.0)
MCV: 81.5 fL (ref 78.0–100.0)
Platelets: 244 10*3/uL (ref 150–400)
RBC: 3.9 MIL/uL (ref 3.87–5.11)
RDW: 14.2 % (ref 11.5–15.5)
WBC: 7.4 10*3/uL (ref 4.0–10.5)

## 2017-12-15 MED ORDER — BUTORPHANOL TARTRATE 2 MG/ML IJ SOLN
2.0000 mg | INTRAMUSCULAR | Status: DC | PRN
  Administered 2017-12-15: 2 mg via INTRAVENOUS
  Filled 2017-12-15: qty 2

## 2017-12-15 MED ORDER — MISOPROSTOL 25 MCG QUARTER TABLET
25.0000 ug | ORAL_TABLET | ORAL | Status: DC | PRN
  Filled 2017-12-15: qty 1

## 2017-12-15 MED ORDER — TERBUTALINE SULFATE 1 MG/ML IJ SOLN
0.2500 mg | Freq: Once | INTRAMUSCULAR | Status: DC | PRN
  Filled 2017-12-15: qty 1

## 2017-12-15 MED ORDER — OXYTOCIN 40 UNITS IN LACTATED RINGERS INFUSION - SIMPLE MED
1.0000 m[IU]/min | INTRAVENOUS | Status: DC
  Administered 2017-12-15: 2 m[IU]/min via INTRAVENOUS
  Filled 2017-12-15: qty 1000

## 2017-12-15 MED ORDER — PHENYLEPHRINE 40 MCG/ML (10ML) SYRINGE FOR IV PUSH (FOR BLOOD PRESSURE SUPPORT)
80.0000 ug | PREFILLED_SYRINGE | INTRAVENOUS | Status: DC | PRN
  Filled 2017-12-15: qty 10
  Filled 2017-12-15: qty 5

## 2017-12-15 MED ORDER — LACTATED RINGERS IV SOLN
500.0000 mL | Freq: Once | INTRAVENOUS | Status: AC
  Administered 2017-12-15: 500 mL via INTRAVENOUS

## 2017-12-15 MED ORDER — ONDANSETRON HCL 4 MG/2ML IJ SOLN: 4.0000 mg | Freq: Four times a day (QID) | INTRAMUSCULAR | Status: DC | PRN

## 2017-12-15 MED ORDER — LIDOCAINE HCL (PF) 1 % IJ SOLN
30.0000 mL | INTRAMUSCULAR | Status: DC | PRN
  Filled 2017-12-15: qty 30

## 2017-12-15 MED ORDER — DIPHENHYDRAMINE HCL 50 MG/ML IJ SOLN: 12.5000 mg | INTRAMUSCULAR | Status: DC | PRN

## 2017-12-15 MED ORDER — ACETAMINOPHEN 325 MG PO TABS: 650.0000 mg | ORAL_TABLET | ORAL | Status: DC | PRN

## 2017-12-15 MED ORDER — EPHEDRINE 5 MG/ML INJ
10.0000 mg | INTRAVENOUS | Status: DC | PRN
  Filled 2017-12-15: qty 2

## 2017-12-15 MED ORDER — LACTATED RINGERS IV SOLN: 500.0000 mL | INTRAVENOUS | Status: DC | PRN

## 2017-12-15 MED ORDER — OXYTOCIN 40 UNITS IN LACTATED RINGERS INFUSION - SIMPLE MED: 2.5000 [IU]/h | INTRAVENOUS | Status: DC

## 2017-12-15 MED ORDER — LACTATED RINGERS IV SOLN
INTRAVENOUS | Status: DC
  Administered 2017-12-15 – 2017-12-16 (×4): via INTRAVENOUS

## 2017-12-15 MED ORDER — LIDOCAINE HCL (PF) 1 % IJ SOLN
INTRAMUSCULAR | Status: DC | PRN
  Administered 2017-12-15: 4 mL
  Administered 2017-12-15: 6 mL

## 2017-12-15 MED ORDER — OXYTOCIN BOLUS FROM INFUSION
500.0000 mL | Freq: Once | INTRAVENOUS | Status: AC
  Administered 2017-12-16: 500 mL via INTRAVENOUS

## 2017-12-15 MED ORDER — PHENYLEPHRINE 40 MCG/ML (10ML) SYRINGE FOR IV PUSH (FOR BLOOD PRESSURE SUPPORT)
80.0000 ug | PREFILLED_SYRINGE | INTRAVENOUS | Status: DC | PRN
  Filled 2017-12-15: qty 5

## 2017-12-15 MED ORDER — SOD CITRATE-CITRIC ACID 500-334 MG/5ML PO SOLN
30.0000 mL | ORAL | Status: DC | PRN
Start: 2017-12-15 — End: 2017-12-16

## 2017-12-15 MED ORDER — FENTANYL 2.5 MCG/ML BUPIVACAINE 1/10 % EPIDURAL INFUSION (WH - ANES)
14.0000 mL/h | INTRAMUSCULAR | Status: DC | PRN
  Administered 2017-12-15 – 2017-12-16 (×2): 14 mL/h via EPIDURAL
  Filled 2017-12-15 (×2): qty 100

## 2017-12-15 NOTE — Progress Notes (Signed)
S: s/p stadol for pain mgmt. Balloon out  O: BP 115/70   Pulse 71   Temp 98.5 F (36.9 C) (Oral)   Resp 16   Ht 5\' 1"  (1.549 m)   Wt 82.5 kg (181 lb 12.8 oz)   LMP 03/07/2017   BMI 34.35 kg/m  Pitocin VE; 4/60/-2 deviated to left Tracing: baseline 125 mod variability Ctx q 2 mins   IMP: postdate Latent phase P) right exaggerated sims. Cont with pitocin

## 2017-12-15 NOTE — Anesthesia Preprocedure Evaluation (Signed)

## 2017-12-15 NOTE — H&P (Signed)
Lauren Thornton is a 27 y.o. female presenting for IOL @ 40 3/7 weeks per pt request. Intact membrane. GBS cx neg. (+) FM OB History    Gravida  1   Para      Term      Preterm      AB      Living        SAB      TAB      Ectopic      Multiple      Live Births             Past Medical History:  Diagnosis Date  . Asthma    Past Surgical History:  Procedure Laterality Date  . FOOT SURGERY Right    Family History: family history includes Asthma in her mother; Hypertension in her father and mother. Social History:  reports that she has never smoked. She has never used smokeless tobacco. She reports that she does not drink alcohol or use drugs.     Maternal Diabetes: No Genetic Screening: Normal Maternal Ultrasounds/Referrals: Abnormal:  Findings:   Fetal renal pyelectasis Fetal Ultrasounds or other Referrals:  None Maternal Substance Abuse:  No Significant Maternal Medications:  None Significant Maternal Lab Results:  Lab values include: Group B Strep negative Other Comments:  fetal pyelectasis resolved  Review of Systems  All other systems reviewed and are negative.  Maternal Medical History:  Fetal activity: Perceived fetal activity is normal.    Prenatal complications: no prenatal complications Prenatal Complications - Diabetes: none.      Last menstrual period 03/07/2017. Exam Physical Exam  Constitutional: She is oriented to person, place, and time. She appears well-developed and well-nourished.  HENT:  Head: Atraumatic.  Eyes: EOM are normal.  Neck: Neck supple.  Cardiovascular: Regular rhythm.  Respiratory: Breath sounds normal.  Musculoskeletal: Normal range of motion.  Neurological: She is alert and oriented to person, place, and time.  Skin: Skin is warm and dry.  Psychiatric: She has a normal mood and affect.   VE 1/60/-3 soft Intracervical balloon placed  Prenatal labs: ABO, Rh: O/Positive/-- (09/19 0000) Antibody: Negative  (09/19 0000) Rubella: Immune (09/19 0000) RPR: Nonreactive (09/19 0000)  HBsAg: Negative (09/19 0000)  HIV: Non-reactive (09/19 0000)  GBS: Negative (03/21 0000)   Assessment/Plan: Post dates P) admit routine labs. Intracervical balloon. Pitocin. Analgesic/epidural prn active labor   Tymeer Vaquera A Zayyan Mullen 12/15/2017, 4:57 PM

## 2017-12-15 NOTE — Anesthesia Procedure Notes (Signed)
Epidural Patient location during procedure: OB  Staffing Anesthesiologist: Lyndle Herrlich, MD  Preanesthetic Checklist Completed: patient identified, pre-op evaluation, timeout performed, IV checked, risks and benefits discussed and monitors and equipment checked  Epidural Patient position: sitting Prep: DuraPrep Patient monitoring: blood pressure and continuous pulse ox Approach: right paramedian Location: L3-L4 Injection technique: LOR air  Needle:  Needle type: Tuohy  Needle gauge: 17 G Needle insertion depth: 6 cm Catheter type: closed end flexible Catheter size: 19 Gauge Catheter at skin depth: 12 cm Test dose: negative  Assessment Sensory level: T8  Additional Notes    Dosing of Epidural:  1st dose, through catheter ............................................Marland Kitchen  Xylocaine 40 mg  2nd dose, through catheter, after waiting 3 minutes........Marland KitchenXylocaine 60 mg    As each dose occurred, patient was free of IV sx; and patient exhibited no evidence of SA injection.  Patient is more comfortable after epidural dosed. Please see RN's note for documentation of vital signs,and FHR which are stable.  Patient reminded not to try to ambulate with numb legs, and that an RN must be present when she attempts to get up.

## 2017-12-16 ENCOUNTER — Inpatient Hospital Stay (HOSPITAL_COMMUNITY)
Admission: RE | Admit: 2017-12-16 | Payer: BLUE CROSS/BLUE SHIELD | Source: Ambulatory Visit | Admitting: Obstetrics and Gynecology

## 2017-12-16 ENCOUNTER — Encounter (HOSPITAL_COMMUNITY): Payer: Self-pay

## 2017-12-16 LAB — RPR: RPR: NONREACTIVE

## 2017-12-16 MED ORDER — ACETAMINOPHEN 325 MG PO TABS
650.0000 mg | ORAL_TABLET | ORAL | Status: DC | PRN
  Administered 2017-12-17 – 2017-12-18 (×3): 650 mg via ORAL
  Administered 2017-12-18: 325 mg via ORAL
  Filled 2017-12-16 (×4): qty 2

## 2017-12-16 MED ORDER — OXYCODONE HCL 5 MG PO TABS
5.0000 mg | ORAL_TABLET | ORAL | Status: DC | PRN
  Administered 2017-12-16 – 2017-12-17 (×4): 5 mg via ORAL
  Filled 2017-12-16 (×4): qty 1

## 2017-12-16 MED ORDER — SIMETHICONE 80 MG PO CHEW
80.0000 mg | CHEWABLE_TABLET | ORAL | Status: DC | PRN
Start: 2017-12-16 — End: 2017-12-18
  Administered 2017-12-17 (×2): 80 mg via ORAL
  Filled 2017-12-16: qty 1

## 2017-12-16 MED ORDER — ZOLPIDEM TARTRATE 5 MG PO TABS: 5.0000 mg | ORAL_TABLET | Freq: Every evening | ORAL | Status: DC | PRN

## 2017-12-16 MED ORDER — WITCH HAZEL-GLYCERIN EX PADS: 1.0000 "application " | MEDICATED_PAD | CUTANEOUS | Status: DC | PRN

## 2017-12-16 MED ORDER — MAGNESIUM HYDROXIDE 400 MG/5ML PO SUSP
30.0000 mL | Freq: Every day | ORAL | Status: DC
  Administered 2017-12-16 – 2017-12-18 (×3): 30 mL via ORAL
  Filled 2017-12-16 (×3): qty 30

## 2017-12-16 MED ORDER — DIBUCAINE 1 % RE OINT: 1.0000 "application " | TOPICAL_OINTMENT | RECTAL | Status: DC | PRN

## 2017-12-16 MED ORDER — OXYCODONE HCL 5 MG PO TABS
10.0000 mg | ORAL_TABLET | ORAL | Status: DC | PRN
  Administered 2017-12-18 (×2): 10 mg via ORAL
  Filled 2017-12-16 (×2): qty 2

## 2017-12-16 MED ORDER — SENNOSIDES-DOCUSATE SODIUM 8.6-50 MG PO TABS
2.0000 | ORAL_TABLET | ORAL | Status: DC
  Administered 2017-12-16 – 2017-12-17 (×2): 2 via ORAL
  Filled 2017-12-16 (×2): qty 2

## 2017-12-16 MED ORDER — ONDANSETRON HCL 4 MG/2ML IJ SOLN: 4.0000 mg | INTRAMUSCULAR | Status: DC | PRN

## 2017-12-16 MED ORDER — DIPHENHYDRAMINE HCL 25 MG PO CAPS: 25.0000 mg | ORAL_CAPSULE | Freq: Four times a day (QID) | ORAL | Status: DC | PRN

## 2017-12-16 MED ORDER — IBUPROFEN 600 MG PO TABS
600.0000 mg | ORAL_TABLET | Freq: Four times a day (QID) | ORAL | Status: DC
  Administered 2017-12-16 – 2017-12-18 (×8): 600 mg via ORAL
  Filled 2017-12-16 (×8): qty 1

## 2017-12-16 MED ORDER — PRENATAL MULTIVITAMIN CH
1.0000 | ORAL_TABLET | Freq: Every day | ORAL | Status: DC
  Administered 2017-12-17 – 2017-12-18 (×2): 1 via ORAL
  Filled 2017-12-16 (×2): qty 1

## 2017-12-16 MED ORDER — ONDANSETRON HCL 4 MG PO TABS: 4.0000 mg | ORAL_TABLET | ORAL | Status: DC | PRN

## 2017-12-16 MED ORDER — BENZOCAINE-MENTHOL 20-0.5 % EX AERO
1.0000 "application " | INHALATION_SPRAY | CUTANEOUS | Status: DC | PRN
  Administered 2017-12-16: 1 via TOPICAL
  Filled 2017-12-16: qty 56

## 2017-12-16 MED ORDER — COCONUT OIL OIL
1.0000 "application " | TOPICAL_OIL | Status: DC | PRN
  Filled 2017-12-16: qty 120

## 2017-12-16 NOTE — Anesthesia Pain Management Evaluation Note (Signed)
  CRNA Pain Management Visit Note  Patient: Lauren Thornton, 27 y.o., female  "Hello I am a member of the anesthesia team at [facility]. We have an anesthesia team available at all times to provide care throughout the hospital, including epidural management and anesthesia for C-section. I don't know your plan for the delivery whether it a natural birth, water birth, IV sedation, nitrous supplementation, doula or epidural, but we want to meet your pain goals."   1.Was your pain managed to your expectations on prior hospitalizations?   No prior hospitalizations  2.What is your expectation for pain management during this hospitalization?     Epidural  3.How can we help you reach that goal? unsure  Record the patient's initial score and the patient's pain goal.   Pain: 2  Pain Goal: 7 [facility] wants you to be able to say your pain was always managed very well.  Casimer Lanius 12/16/2017

## 2017-12-16 NOTE — Progress Notes (Signed)
Patient remains unable to bend knees postpartum.  Pt is able to wiggle toes and rock legs slightly side to side.  CRNA and Anesthesiologist have evaluated patient.  Will continue to monitor closely.

## 2017-12-16 NOTE — Progress Notes (Signed)
S: sleeping  O; Epidural pitocin 12 miu VE fully (+) 1 station bulging membrane occiput  transverse presentation  Tracing: baseline  140 (+) early decel Ctx q 2 mins  IMP: complete P) cont with exaggerated sims. Labor vtx down

## 2017-12-16 NOTE — Progress Notes (Signed)
S: pushing  O: Pitocin VE fully +1/+2 station AROM bulging membrane Clear fluid  Tracing: baseline 125 (+) variables (+) early decel Ctx q 24mins  IMP: complete P) cont pushing

## 2017-12-16 NOTE — Progress Notes (Signed)
S:  Comfortable   O:  Epidural Pitocin VE per RN 7 cm/100/+1 Tracing: baseline 125 (+) accel Ctx q 2-79mins  IMP: active phase Post term P) cont present mgmt

## 2017-12-16 NOTE — Lactation Note (Signed)
This note was copied from a baby's chart. Lactation Consultation Note  Patient Name: Lauren Thornton GBEEF'E Date: 12/16/2017 Reason for consult: Initial assessment   P1, Baby 37 hours old. Room full of visitors. Offered to help w/ breastfeeding but mother requested assistance later. Grandmother bottle feeding infant formula. Suggest waiting for baby to cue before giving him the formula but grandmother did not stop giving the baby the bottle while LC in the room. Discussed the size of the infant's stomach. Mother states she tried breastfeeding but he did not latch so she offered the bottle. Suggest she call for latch assistance. Mom encouraged to feed baby 8-12 times/24 hours and with feeding cues.  Provided family with lactation brochure, feeding sheet and resource sheet.     Maternal Data Has patient been taught Hand Expression?: No Does the patient have breastfeeding experience prior to this delivery?: No  Feeding Feeding Type: Bottle Fed - Formula  LATCH Score                   Interventions    Lactation Tools Discussed/Used     Consult Status Consult Status: Follow-up Date: 12/17/17 Follow-up type: In-patient    Vivianne Master [facility] 12/16/2017, 6:15 PM

## 2017-12-17 LAB — CBC
HCT: 22.4 % — ABNORMAL LOW (ref 36.0–46.0)
HEMOGLOBIN: 7.6 g/dL — AB (ref 12.0–15.0)
MCH: 27.5 pg (ref 26.0–34.0)
MCHC: 33.9 g/dL (ref 30.0–36.0)
MCV: 81.2 fL (ref 78.0–100.0)
Platelets: 202 10*3/uL (ref 150–400)
RBC: 2.76 MIL/uL — AB (ref 3.87–5.11)
RDW: 14.4 % (ref 11.5–15.5)
WBC: 16 10*3/uL — ABNORMAL HIGH (ref 4.0–10.5)

## 2017-12-17 MED ORDER — MINERAL OIL PO OIL
TOPICAL_OIL | Freq: Every day | ORAL | Status: DC
  Administered 2017-12-17 – 2017-12-18 (×2): 30 mL via ORAL
  Filled 2017-12-17 (×3): qty 30

## 2017-12-17 MED ORDER — SODIUM CHLORIDE 0.9 % IV SOLN
510.0000 mg | Freq: Once | INTRAVENOUS | Status: AC
  Administered 2017-12-17: 510 mg via INTRAVENOUS
  Filled 2017-12-17: qty 17

## 2017-12-17 MED ORDER — PNEUMOCOCCAL VAC POLYVALENT 25 MCG/0.5ML IJ INJ
0.5000 mL | INJECTION | INTRAMUSCULAR | Status: DC
  Filled 2017-12-17: qty 0.5

## 2017-12-17 NOTE — Progress Notes (Addendum)
PPD #1, SVD, 4th degree repair, baby boy "[place]"  S:  Reports feeling sore and tired              Tolerating po/ No nausea or vomiting / Denies dizziness or SOB             Bleeding is light             Pain controlled with Motrin and Oxycodone IR             Up ad lib / ambulatory with assistance - had dizziness earlier today, and epidural was slow to resolve. Foley catheter left in place until ambulatory and dizziness resolved - s/p IV FE  Newborn breast feeding with formula supplementation - has been working with lactation; also using DEBP / Circumcision - planning prior to discharge   O:               VS: BP 105/67 (BP Location: Right Arm)   Pulse 78   Temp 98.7 F (37.1 C) (Oral)   Resp 16   Ht 5\' 1"  (1.549 m)   Wt 82.5 kg (181 lb 12.8 oz)   LMP 03/07/2017   SpO2 100%   Breastfeeding? Unknown   BMI 34.35 kg/m    LABS:              Recent Labs    12/15/17 1724 12/17/17 0547  WBC 7.4 16.0*  HGB 10.7* 7.6*  PLT 244 202               Blood type: --/--/O POS, O POS Performed at [facility], [address], [place], [place] [address]  [phone]/20 1724)  Rubella: Immune (09/19 0000)                     I&O: Intake/Output      04/21 0701 - 04/22 0700 04/22 0701 - 04/23 0700   Urine (mL/kg/hr) 2045 (1)    Blood 400    Total Output 2445    Net -2445         Urine Occurrence 1 x                  Physical Exam:             Alert and oriented X3  Lungs: Clear and unlabored  Heart: regular rate and rhythm / no murmurs  Abdomen: soft, non-tender, non-distended              Fundus: firm, non-tender, U-1  Perineum: well approximated 4th degree laceration, healing well, edema noted, but no evidence of ecchymosis, hematoma or s/s of infection, ice pack in place  Lochia: small, no clots  Extremities: no edema, no calf pain or tenderness    A/P: PPD # 1, SVD  4th degree repair    - On Milk of Magnesia and Sennakot-S 2 tabs daily    - Continue using ice packs and begin  sitz baths 3-5 times daily   - Dermoplast spray PRN     - Mineral oil 38mL PO daily   ABL Anemia compounding chronic maternal IDA   - IV Feraheme x 1 today; plan to repeat IV iron infusion in 1 week  D/C Foley catheter   Doing well - stable status  Ambulation encouraged  May shower today   Continue working with lactation PRN   Routine post partum orders  Anticipate discharge home tomorrow - will plan for f/u with Dr. Garwin Brothers in  2 weeks   Lars Pinks, MSN, CNM [facility]

## 2017-12-17 NOTE — Lactation Note (Addendum)
This note was copied from a baby's chart. Lactation Consultation Note  Patient Name: Lauren Thornton CHYIF'O Date: 12/17/2017 Follow-up assessment   Baby 14 hrs old.   Mom called out for assistance with latching baby after a day of exclusive bottle feeding due to not feeling well,.   Mom with large breasts and short shafted erect nipples that tend to invert when breasts sandwiched.  Breast massage and hand expression taught, drops of colostrum noted. Tried cross cradle, and football holds, and baby unable to sustain a deep areolar latch.   Initiated a nipple shield, 16 mm nipple shield best fit at this time.  Initiated formula supplementation at the breast using 5 fr feeding tube and syringe, as baby wouldn't suck with a consistent pattern. Baby pushing nipple out of his mouth.  Mom needing a lot of guidance with breast support, baby's head support, and sandwiching of breast.      Plan -  1- Offer breast on cue, goal is 8-12 feedings per 24 hrs. 2-Use nipple shield to assist with latch 3- offer supplement 15 ml today of EBM+/formula 4- Pump both breasts for 15 mins on initiation setting to support a full milk supply 5- Keep baby STS as much as possible. 6- ask for assistance prn.   Maternal Data Formula Feeding for Exclusion: No Has patient been taught Hand Expression?: Yes Does the patient have breastfeeding experience prior to this delivery?: No  Feeding Feeding Type: Formula Nipple Type: Slow - flow Length of feed: 30 min  LATCH Score Latch: Repeated attempts needed to sustain latch, nipple held in mouth throughout feeding, stimulation needed to elicit sucking reflex.  Audible Swallowing: Spontaneous and intermittent(with supplement only)  Type of Nipple: Everted at rest and after stimulation(short nipple shaft that inverts with breast sandwiching)  Comfort (Breast/Nipple): Soft / non-tender  Hold (Positioning): Assistance needed to correctly position infant at breast and  maintain latch.  LATCH Score: 8  Interventions Interventions: Breast feeding basics reviewed;Assisted with latch;Skin to skin;Breast massage;Hand express;Breast compression;Adjust position;Support pillows;Position options;Expressed milk;Shells;Hand pump;DEBP  Lactation Tools Discussed/Used Tools: Shells;Pump;78F feeding tube / Syringe;Nipple Shields Nipple shield size: 16 Shell Type: Inverted Breast pump type: Double-Electric Breast Pump Pump Review: Setup, frequency, and cleaning;Milk Storage Initiated by:: Cipriano Mile RN IBCLC Date initiated:: 12/17/17   Consult Status Consult Status: Follow-up Date: 12/18/17 Follow-up type: [place] 12/17/2017, 10:49 AM

## 2017-12-17 NOTE — Anesthesia Postprocedure Evaluation (Signed)
Anesthesia Post Note  Patient: Lauren Thornton  Procedure(s) Performed: AN AD HOC LABOR EPIDURAL     Patient location during evaluation: Mother Baby Anesthesia Type: Epidural Level of consciousness: awake and alert and oriented Pain management: pain level controlled Vital Signs Assessment: post-procedure vital signs reviewed and stable Respiratory status: spontaneous breathing, nonlabored ventilation and respiratory function stable Cardiovascular status: stable Postop Assessment: no headache, no backache, epidural receding and patient able to bend at knees Anesthetic complications: no Comments: Epidural was slow to resolve. Patient feels much stronger today. She has not ambulated since last pm.    Last Vitals:  Vitals:   12/17/17 0506 12/17/17 0830  BP: (!) 104/53 105/67  Pulse: 79 78  Resp: 18 16  Temp: 37.1 C   SpO2:  100%    Last Pain:  Vitals:   12/17/17 0506  TempSrc: Oral  PainSc: 2    Pain Goal:                 Savino Whisenant A.

## 2017-12-17 NOTE — Progress Notes (Signed)
Pt ambulated to bathroom for peri care. When ambulating back to bed, pt stated she felt dizzy and nauseous. Once back in bed, she stated she felt better, but that she does not want to walk again during the night. Pt encouraged to drink fluids, and blood pressure when back in bed was 116/71 with a pulse of 97. Uterus firm and midline with a small amount of bleeding.

## 2017-12-18 ENCOUNTER — Other Ambulatory Visit: Payer: Self-pay | Admitting: Obstetrics and Gynecology

## 2017-12-18 MED ORDER — MAGNESIUM HYDROXIDE 400 MG/5ML PO SUSP: 30.0000 mL | Freq: Every day | ORAL | 0 refills | Status: DC

## 2017-12-18 MED ORDER — OXYCODONE HCL 5 MG PO TABS: 5.0000 mg | ORAL_TABLET | ORAL | 0 refills | Status: DC | PRN

## 2017-12-18 MED ORDER — MINERAL OIL PO OIL
TOPICAL_OIL | Freq: Every day | ORAL | 3 refills | Status: DC
Start: 2017-12-19 — End: 2018-08-31

## 2017-12-18 MED ORDER — SENNOSIDES-DOCUSATE SODIUM 8.6-50 MG PO TABS: 2.0000 | ORAL_TABLET | ORAL | 3 refills | Status: DC

## 2017-12-18 MED ORDER — IBUPROFEN 600 MG PO TABS: 600.0000 mg | ORAL_TABLET | Freq: Four times a day (QID) | ORAL | 0 refills | Status: DC

## 2017-12-18 NOTE — Discharge Summary (Signed)
Obstetric Discharge Summary   Patient Name: Lauren Thornton DOB: 01-14-1991 MRN: [id number]  Date of Admission: 12/15/2017 Date of Discharge: 12/18/2017 Date of Delivery: 12/16/17 Gestational Age at Delivery: [redacted]w[redacted]d  Primary OB: [facility] - Dr. Garwin Brothers  Antepartum complications:  - Fetal Pyelectasis - resolved  Prenatal Labs:  ABO, Rh: O/Positive/-- (09/19 0000) Antibody: Negative (09/19 0000) Rubella: Immune (09/19 0000) RPR: Nonreactive (09/19 0000)  HBsAg: Negative (09/19 0000)  HIV: Non-reactive (09/19 0000)  GBS: Negative (03/21 0000)   Admitting Diagnosis: Elective IOL at 40+3 weeks   Secondary Diagnoses: Patient Active Problem List   Diagnosis Date Noted  . Postpartum care following vaginal delivery (4/21) 12/16/2017  . Fourth degree perineal laceration 12/16/2017  . Encounter for elective induction of labor 12/15/2017  . Post-dates pregnancy 12/15/2017    Augmentation: Intracervical balloon and Pitocin , AROM  Date of Delivery: 12/16/17 Delivered By: Dr. Garwin Brothers  Delivery Type: spontaneous vaginal delivery Anesthesia: epidural Placenta: sponatneous Laceration: 4th degree perineal, bilateral vaginal sulcus  Episiotomy: none  Newborn Data: Live born female  Birth Weight: 6 lb 14.4 oz (3130 g) APGAR: 8, 9  Newborn Delivery   Birth date/time:  12/16/2017 09:50:00 Delivery type:  Vaginal, Spontaneous        Hospital/Postpartum Course  (Vaginal Delivery): Pt. Admitted for elective IOL for term.  See notes and delivery summary for details. Patient's postpartum course was complicated by 4th degree repair and acute blood loss anemia requiring IV iron infusion.  By time of discharge on PPD#2, her pain was controlled on oral pain medications; she had appropriate lochia and was ambulating, voiding without difficulty and tolerating regular diet.  She was deemed stable for discharge to home.     Labs: CBC Latest Ref Rng & Units 12/17/2017 12/15/2017 02/07/2016  WBC  4.0 - 10.5 K/uL 16.0(H) 7.4 7.2  Hemoglobin 12.0 - 15.0 g/dL 7.6(L) 10.7(L) 12.0  Hematocrit 36.0 - 46.0 % 22.4(L) 31.8(L) 36.7  Platelets 150 - 400 K/uL 353 299 242   Conflict (See Lab Report): O POS/O POS Performed at [facility], [address], [place], [place] [address]   Physical exam:  BP 108/75 (BP Location: Right Arm)   Pulse 79   Temp 97.7 F (36.5 C) (Oral)   Resp 20   Ht 5\' 1"  (1.549 m)   Wt 82.5 kg (181 lb 12.8 oz)   LMP 03/07/2017   SpO2 100%   Breastfeeding? Unknown   BMI 34.35 kg/m  General: alert and no distress Pulm: normal respiratory effort Lochia: appropriate Abdomen: soft, NT Uterine Fundus: firm, below umbilicus Perineum: healing well, no significant erythema, mild labial edema Extremities: No evidence of DVT seen on physical exam. No lower extremity edema.   Disposition: stable, discharge to home Baby Feeding: breast milk and formula Baby Disposition: home with mom  Contraception: unknown  Rh Immune globulin given: N/A Rubella vaccine given: N/A Tdap vaccine given in AP or PP setting: UTD Flu vaccine given in AP or PP setting: UTD   Plan:  HENSLEE LOTTMAN was discharged to home in good condition. Follow-up appointment at [facility] in 2 weeks.  Will need IV iron infusion in 1 week.   Discharge Instructions: Per After Visit Summary. Refer to After Visit Summary and [facility] OB/GYN discharge booklet  Activity: Advance as tolerated. Pelvic rest for 6 weeks.   Diet: Regular, Heart Healthy Discharge Medications: Allergies as of 12/18/2017   No Known Allergies     Medication List    STOP  taking these medications   diphenhydramine-acetaminophen 25-500 MG Tabs tablet Commonly known as:  TYLENOL PM     TAKE these medications   albuterol 108 (90 Base) MCG/ACT inhaler Commonly known as:  PROVENTIL HFA;VENTOLIN HFA Inhale 2 puffs into the lungs every 6 (six) hours as needed. For shortness of breath   albuterol (2.5 MG/3ML) 0.083%  nebulizer solution Commonly known as:  PROVENTIL Take 2.5 mg by nebulization every 6 (six) hours as needed for wheezing or shortness of breath.   CONCEPT DHA 53.5-38-1 MG Caps TK 1 C PO QD   ibuprofen 600 MG tablet Commonly known as:  ADVIL,MOTRIN Take 1 tablet (600 mg total) by mouth every 6 (six) hours.   magnesium hydroxide 400 MG/5ML suspension Commonly known as:  MILK OF MAGNESIA Take 30 mLs by mouth daily. Start taking on:  12/19/2017   mineral oil liquid Take by mouth daily. Start taking on:  12/19/2017   oxyCODONE 5 MG immediate release tablet Commonly known as:  Oxy IR/ROXICODONE Take 1 tablet (5 mg total) by mouth every 4 (four) hours as needed (pain scale 4-7).   senna-docusate 8.6-50 MG tablet Commonly known as:  Senokot-S Take 2 tablets by mouth daily. Start taking on:  12/19/2017            Discharge Care Instructions  (From admission, onward)        Start     Ordered   12/18/17 0000  Discharge wound care:    Comments:  Sitz baths 3-5 times per day and continue with ice packs and Dermoplast spray   12/18/17 1057     Outpatient follow up:  Follow-up Information    Servando Salina, MD. Schedule an appointment as soon as possible for a visit in 2 week(s).   Specialty:  Obstetrics and Gynecology Why:  Postpartum visit to evaluate 4th degree repair; then 6 week PP visit  Contact information: [address] [place] [address] [phone]           Signed:  Lars Pinks, MSN, CNM [facility]

## 2017-12-18 NOTE — Progress Notes (Signed)
Discharge teaching complete. Pt verbalized understanding.

## 2017-12-18 NOTE — Progress Notes (Signed)
PPD #2, SVD, 4th degree repair, baby boy "[place]"   S:  Reports feeling tired and sore from repair. Ready to be discharged home today   Reports concern for unable to feel the urge to urinate; denies incontinence, but states she does not have the urge or much sensation when she is urinating.  Reports she has been trying to get up frequently to empty her bladder.   Has had a bowel movement without problems - using sitz baths             Tolerating po/ No nausea or vomiting / Denies dizziness or SOB             Bleeding is moderate             Pain controlled with Motrin and Oxycodone IR             Up ad lib / ambulatory / voiding QS  Newborn breast feeding with formula supplementation - also using DEBP; reports minimal colostrum  / Circumcision - completed yesterday   O:               VS: BP 108/75 (BP Location: Right Arm)   Pulse 79   Temp 97.7 F (36.5 C) (Oral)   Resp 20   Ht 5\' 1"  (1.549 m)   Wt 82.5 kg (181 lb 12.8 oz)   LMP 03/07/2017   SpO2 100%   Breastfeeding? Unknown   BMI 34.35 kg/m    LABS:              Recent Labs    12/15/17 1724 12/17/17 0547  WBC 7.4 16.0*  HGB 10.7* 7.6*  PLT 244 202               Blood type: --/--/O POS, O POS Performed at [facility], [address] [place], [place] [address]  [phone]/20 1724)  Rubella: Immune (09/19 0000)                     I&O: Intake/Output      04/22 0701 - 04/23 0700 04/23 0701 - 04/24 0700   P.O. 480    Total Intake(mL/kg) 480 (5.8)    Urine (mL/kg/hr) 1030 (0.5)    Blood     Total Output 1030    Net -550                       Physical Exam:             Alert and oriented X3  Lungs: Clear and unlabored  Heart: regular rate and rhythm / no murmurs  Abdomen: soft, non-tender, non-distended              Fundus: firm, non-tender, U-1  Perineum: well approximated 4th degree laceration; mild labial edema, no ecchymosis or evidence of a hematoma, ice pack pad in place   Lochia: small, no  clots  Extremities: no edema, no calf pain or tenderness   A/P:     PPD # 1, SVD             4th degree repair                          - On Milk of Magnesia, Sennakot-S 2 tabs daily, mineral oil daily                         -  Continue using ice packs and begin sitz baths 3-5 times daily                         - Dermoplast spray PRN     - Care and discharge instructions reviewed on maintaining current bowel regimen              ABL Anemia compounding chronic maternal IDA                         - IV Feraheme x 1 today; plan to repeat IV iron infusion in 1 week  Routine post partum orders  Encouraged to void every 2-3 hours - reassurance provided that about resuming sensation after foley catheter and epidural.  Warning s/s reviewed   Discharge home today  WOB discharge book and instructions reviewed   F/u with Dr. Garwin Brothers in 2 weeks to evaluate 4th degree repair  Will need IV iron transfusion in 1 week - our office to schedule for patient   Lars Pinks, MSN, CNM [facility]

## 2017-12-18 NOTE — Lactation Note (Signed)
This note was copied from a baby's chart. Lactation Consultation Note  Patient Name: Lauren Thornton OFHQR'F Date: 12/18/2017 Reason for consult: Follow-up assessment;Difficult latch  Requested by Mom to assist with breastfeeding.  Baby 78 hrs old at 3% weight loss. Mom pumped this am, and obtained 5 ml of colostrum, fed to baby. Baby has been fed all bottles last day. Assisted with pre-pumping to pull nipple out.  Colostrum expressed. Initiated 16 mm nipple shield.  Mom needing much assisitance with postioning baby and placing nipple shield, and latching baby.  Mom very insecure with handling baby.  Baby latched on after several attempts.  Used curved tip syringe to introduce formula.  Baby able to attain a deep areolar latch, and nutritive sucking and swallowing identified.   Encouraged Mom to offer breast at each feeding, using nipple shield to facilitate a deep areolar latch.  To supplement prn as Mom's choice.  Mom has the 5 fr feeding tube and syringe and curved tip syringe.  Taught FOB how to pace bottle feed.   Encouraged post breastfeeding pumping.  Mom has a DEBP at home.   Encouraged STS as much as possible. Outpatient lactation appointment referral made. Mom aware of OP lactation support available.  Encouraged to call prn.   Feeding Feeding Type: Breast Fed Nipple Type: Slow - flow Length of feed: 20 min  LATCH Score Latch: Repeated attempts needed to sustain latch, nipple held in mouth throughout feeding, stimulation needed to elicit sucking reflex.  Audible Swallowing: A few with stimulation(with supplement)  Type of Nipple: Everted at rest and after stimulation(short nipple shaft)  Comfort (Breast/Nipple): Soft / non-tender  Hold (Positioning): Assistance needed to correctly position infant at breast and maintain latch.  LATCH Score: 7  Interventions Interventions: Breast feeding basics reviewed;Assisted with latch;Skin to skin;Breast massage;Hand express;Breast  compression;Adjust position;Support pillows;Position options;Expressed milk;Shells;Hand pump;DEBP  Lactation Tools Discussed/Used Tools: Shells;Pump;Nipple Shields Nipple shield size: 16 Shell Type: Inverted Breast pump type: Double-Electric Breast Pump;Manual   Consult Status Consult Status: Complete Date: 12/18/17 Follow-up type: [place], Vayden Weinand E 12/18/2017, 11:48 AM

## 2017-12-20 ENCOUNTER — Telehealth (HOSPITAL_COMMUNITY): Payer: Self-pay | Admitting: Lactation Services

## 2017-12-20 ENCOUNTER — Other Ambulatory Visit: Payer: Self-pay | Admitting: Obstetrics and Gynecology

## 2017-12-20 NOTE — Telephone Encounter (Signed)
Lactation Telephone return call:  [facility] returned a call to this mom regarding Dr. Hazle Quant [facility] referral request.  Per mom was called by [facility] clinic for [facility] appt. Next Thursday May 2nd.  Mom expressed feeling that the appt. Was so far away when she really desires  To re-latch baby and get assistance sooner to re-latch.  Mom also mentioned her milk in both breast, and she was able to pump off 2 oz yesterday ( and  Pumped 5 times yesterday ) and seemed to have more milk then compared today.  LC reviewed supply and demand and the importance of consistent pumping when breast were feeling full  And not to wait until they were engorged. Pumping at least 8 x's in 24 hours both breast for 15 -20 mins  To establish and protect milk supply.  LC reviewed engorgement prevention and tx.  Per mom rented a DEBP from the hospital and per mom is working well for her. She has tried to re-latch the  Baby several times and he stays for a short time and comes off. Per mom things she has flat nipples.  LC recommended trying breast massage, hand express, roll nipple, and with firm support try the football  Position instead of the cradle for support and decrease baby's frustration. Also try giving the baby an appetizer of EBM From a bottle ( 10 ml) swirl the bottle out of the baby's mouth and try latching.  If he latches, let him feed until he softens the breast. Offer 2nd breast , if he only feeds 1st breast, release 2nd breast with hand expressing or pumping to comfort.   LC mentioned to mom she will place a request in the [facility] clinic basket to call her again to see if they can move  Her LC appt. To the beginning of the week instead of next Thursday.  LC also will discuss with the O/P LC.

## 2017-12-21 ENCOUNTER — Other Ambulatory Visit: Payer: Self-pay | Admitting: Obstetrics and Gynecology

## 2017-12-22 ENCOUNTER — Encounter (HOSPITAL_COMMUNITY): Payer: Self-pay | Admitting: *Deleted

## 2017-12-22 ENCOUNTER — Inpatient Hospital Stay (HOSPITAL_COMMUNITY)
Admission: AD | Admit: 2017-12-22 | Discharge: 2017-12-23 | Disposition: A | Discharge status: Home / Self Care | Payer: BLUE CROSS/BLUE SHIELD | Source: Ambulatory Visit | Attending: Obstetrics and Gynecology | Admitting: Obstetrics and Gynecology

## 2017-12-22 DIAGNOSIS — R339 Retention of urine, unspecified: Secondary | ICD-10-CM | POA: Insufficient documentation

## 2017-12-22 DIAGNOSIS — O9089 Other complications of the puerperium, not elsewhere classified: Secondary | ICD-10-CM | POA: Insufficient documentation

## 2017-12-22 NOTE — Discharge Instructions (Signed)
Continue prior instructions( postpartum)

## 2017-12-22 NOTE — MAU Provider Note (Signed)
History     Chief Complaint  Patient presents with  . difficulty voiding  27 yo G1P1 MBF presents with c/o difficulty voiding. Pt notes freq small urination but still painful lower abdomen Pt had been seen in office yesterday for similar complaint and had voided as soon as she came to the office. Therefore straight cath for postvoid residual was 650. Pt had felt better. U/a, ucx done on specimen and pt started on Augmentin pending result. Pt has started antibiotics. Pain better managed with dilaudid script given in office and pt has had BM w/o problem.  No fever. Her delivery was complicated by 4th degree perineal and bilateral vaginal sulcus laceration   OB History    Gravida  1   Para  1   Term  1   Preterm      AB      Living  1     SAB      TAB      Ectopic      Multiple  0   Live Births  1           Past Medical History:  Diagnosis Date  . Asthma     Past Surgical History:  Procedure Laterality Date  . FOOT SURGERY Right     Family History  Problem Relation Age of Onset  . Asthma Mother   . Hypertension Mother   . Hypertension Father     Social History   Tobacco Use  . Smoking status: Never Smoker  . Smokeless tobacco: Never Used  Substance Use Topics  . Alcohol use: No  . Drug use: No    Allergies: No Known Allergies  Medications Prior to Admission  Medication Sig Dispense Refill Last Dose  . albuterol (PROVENTIL HFA;VENTOLIN HFA) 108 (90 BASE) MCG/ACT inhaler Inhale 2 puffs into the lungs every 6 (six) hours as needed. For shortness of breath   prn  . albuterol (PROVENTIL) (2.5 MG/3ML) 0.083% nebulizer solution Take 2.5 mg by nebulization every 6 (six) hours as needed for wheezing or shortness of breath.    prn  . ibuprofen (ADVIL,MOTRIN) 600 MG tablet Take 1 tablet (600 mg total) by mouth every 6 (six) hours. 30 tablet 0   . magnesium hydroxide (MILK OF MAGNESIA) 400 MG/5ML suspension Take 30 mLs by mouth daily. 360 mL 0   . mineral  oil liquid Take by mouth daily. 180 mL 3   . oxyCODONE (OXY IR/ROXICODONE) 5 MG immediate release tablet Take 1 tablet (5 mg total) by mouth every 4 (four) hours as needed (pain scale 4-7). 20 tablet 0   . Prenat-FeFum-FePo-FA-Omega 3 (CONCEPT DHA) 53.5-38-1 MG CAPS TK 1 C PO QD  12 Past Week at Unknown time  . senna-docusate (SENOKOT-S) 8.6-50 MG tablet Take 2 tablets by mouth daily. 60 tablet 3      Physical Exam   Blood pressure 126/76, pulse 67, temperature 98.1 F (36.7 C), resp. rate 18, currently breastfeeding.  General appearance: alert, cooperative and no distress Pelvic: perineum well approximated with decreased edema. rectal deferred  Urethra nl Cath urine 650cc ED Course  Urinary retention Postpartum P) insert foley with leg bag. F/u in office this week. Cont with antibiotics. D/c home MDM   Marvene Staff, MD 11:44 PM 12/22/2017

## 2017-12-22 NOTE — MAU Note (Signed)
SVD 12/16/17 and had 4th degree tear. Has been having trouble voiding. Has trouble feeling urge to void and then is very painful to void. Saw Dr Garwin Brothers yesterday and cath specimen sent off and started on antibiotics. Continues to have pain and difficutly voiding. When voiding today feels pressure but can only go small amts.

## 2017-12-23 DIAGNOSIS — O9089 Other complications of the puerperium, not elsewhere classified: Secondary | ICD-10-CM | POA: Diagnosis present

## 2017-12-23 DIAGNOSIS — R339 Retention of urine, unspecified: Secondary | ICD-10-CM | POA: Diagnosis not present

## 2017-12-23 NOTE — MAU Note (Signed)
Sent Patient home with foley cath. Emptied 600cc prior to discharge. Changed to leg bag and instructed pt on foley care. Pt to f/u in office next week.

## 2017-12-25 ENCOUNTER — Other Ambulatory Visit: Payer: Self-pay | Admitting: Obstetrics and Gynecology

## 2017-12-25 ENCOUNTER — Ambulatory Visit (HOSPITAL_COMMUNITY)
Admission: RE | Admit: 2017-12-25 | Discharge: 2017-12-25 | Disposition: A | Discharge status: Home / Self Care | Payer: BLUE CROSS/BLUE SHIELD | Source: Ambulatory Visit | Attending: Obstetrics and Gynecology | Admitting: Obstetrics and Gynecology

## 2017-12-25 DIAGNOSIS — D509 Iron deficiency anemia, unspecified: Secondary | ICD-10-CM | POA: Diagnosis not present

## 2017-12-25 DIAGNOSIS — N6320 Unspecified lump in the left breast, unspecified quadrant: Secondary | ICD-10-CM

## 2017-12-25 MED ORDER — SODIUM CHLORIDE 0.9 % IV SOLN
INTRAVENOUS | Status: DC
  Administered 2017-12-25: 11:00:00 via INTRAVENOUS

## 2017-12-25 MED ORDER — SODIUM CHLORIDE 0.9 % IV SOLN
510.0000 mg | Freq: Once | INTRAVENOUS | Status: AC
  Administered 2017-12-25: 510 mg via INTRAVENOUS
  Filled 2017-12-25 (×2): qty 17

## 2017-12-25 NOTE — Discharge Instructions (Signed)

## 2017-12-25 NOTE — Progress Notes (Signed)
PATIENT CARE CENTER NOTE  Diagnosis: Iron deficiency anemia    Provider: Dr. Garwin Brothers    Procedure: IV Feraheme    Note: Patient received infusion of IV Feraheme. Patient tolerated infusion well with no adverse reaction. Patient refused to stay for 30 minutes after infusion because she was late for another appointment. Vitals taken before and after infusion. Patient stable at discharge. Discharge instructions given to patient. Patient alert, oriented and ambulatory at discharge.

## 2017-12-28 ENCOUNTER — Ambulatory Visit
Admission: RE | Admit: 2017-12-28 | Discharge: 2017-12-28 | Disposition: A | Discharge status: Home / Self Care | Payer: BLUE CROSS/BLUE SHIELD | Source: Ambulatory Visit | Attending: Obstetrics and Gynecology | Admitting: Obstetrics and Gynecology

## 2017-12-28 DIAGNOSIS — N6320 Unspecified lump in the left breast, unspecified quadrant: Secondary | ICD-10-CM

## 2017-12-31 ENCOUNTER — Other Ambulatory Visit: Payer: BLUE CROSS/BLUE SHIELD

## 2018-01-16 ENCOUNTER — Other Ambulatory Visit: Payer: Self-pay

## 2018-01-16 ENCOUNTER — Inpatient Hospital Stay (HOSPITAL_COMMUNITY)
Admission: AD | Admit: 2018-01-16 | Discharge: 2018-01-16 | Disposition: A | Discharge status: Home / Self Care | Payer: BLUE CROSS/BLUE SHIELD | Source: Ambulatory Visit | Attending: Obstetrics and Gynecology | Admitting: Obstetrics and Gynecology

## 2018-01-16 ENCOUNTER — Other Ambulatory Visit: Payer: Self-pay | Admitting: Surgery

## 2018-01-16 ENCOUNTER — Encounter (HOSPITAL_COMMUNITY): Payer: Self-pay

## 2018-01-16 DIAGNOSIS — O165 Unspecified maternal hypertension, complicating the puerperium: Secondary | ICD-10-CM

## 2018-01-16 DIAGNOSIS — O135 Gestational [pregnancy-induced] hypertension without significant proteinuria, complicating the puerperium: Secondary | ICD-10-CM | POA: Insufficient documentation

## 2018-01-16 HISTORY — DX: Pneumonia, unspecified organism: J18.9

## 2018-01-16 HISTORY — DX: Anemia, unspecified: D64.9

## 2018-01-16 LAB — COMPREHENSIVE METABOLIC PANEL
ALBUMIN: 4.2 g/dL (ref 3.5–5.0)
ALK PHOS: 88 U/L (ref 38–126)
ALT: 18 U/L (ref 14–54)
ANION GAP: 12 (ref 5–15)
AST: 23 U/L (ref 15–41)
BUN: 8 mg/dL (ref 6–20)
CO2: 20 mmol/L — ABNORMAL LOW (ref 22–32)
Calcium: 9.7 mg/dL (ref 8.9–10.3)
Chloride: 108 mmol/L (ref 101–111)
Creatinine, Ser: 0.73 mg/dL (ref 0.44–1.00)
Glucose, Bld: 84 mg/dL (ref 65–99)
POTASSIUM: 3.3 mmol/L — AB (ref 3.5–5.1)
SODIUM: 140 mmol/L (ref 135–145)
TOTAL PROTEIN: 7.7 g/dL (ref 6.5–8.1)
Total Bilirubin: 0.2 mg/dL — ABNORMAL LOW (ref 0.3–1.2)

## 2018-01-16 LAB — CBC WITH DIFFERENTIAL/PLATELET
Basophils Absolute: 0 10*3/uL (ref 0.0–0.1)
Basophils Relative: 1 %
EOS ABS: 0.1 10*3/uL (ref 0.0–0.7)
EOS PCT: 2 %
HCT: 39.2 % (ref 36.0–46.0)
HEMOGLOBIN: 12.8 g/dL (ref 12.0–15.0)
Lymphocytes Relative: 38 %
Lymphs Abs: 2.1 10*3/uL (ref 0.7–4.0)
MCH: 28.6 pg (ref 26.0–34.0)
MCHC: 32.7 g/dL (ref 30.0–36.0)
MCV: 87.5 fL (ref 78.0–100.0)
Monocytes Absolute: 0.4 10*3/uL (ref 0.1–1.0)
Monocytes Relative: 7 %
NEUTROS PCT: 52 %
Neutro Abs: 2.9 10*3/uL (ref 1.7–7.7)
PLATELETS: 337 10*3/uL (ref 150–400)
RBC: 4.48 MIL/uL (ref 3.87–5.11)
RDW: 14.8 % (ref 11.5–15.5)
WBC: 5.6 10*3/uL (ref 4.0–10.5)

## 2018-01-16 LAB — URIC ACID: URIC ACID, SERUM: 3.4 mg/dL (ref 2.3–6.6)

## 2018-01-16 MED ORDER — LABETALOL HCL 100 MG PO TABS
200.0000 mg | ORAL_TABLET | Freq: Once | ORAL | Status: AC
Start: 2018-01-16 — End: 2018-01-16
  Administered 2018-01-16: 200 mg via ORAL
  Filled 2018-01-16: qty 2

## 2018-01-16 MED ORDER — LABETALOL HCL 100 MG PO TABS: 200.0000 mg | ORAL_TABLET | Freq: Three times a day (TID) | ORAL | 4 refills | Status: DC

## 2018-01-16 NOTE — Progress Notes (Signed)
   01/16/18 1632 01/16/18 1644 01/16/18 1701  Vital Signs  BP (!) 148/92 (!) 155/99 (!) 153/92  Dr Garwin Brothers on unit & viewed blood pressures above, report given that pt not symptomatic.  Orders rec'd to draw labs, not send any urine.

## 2018-01-16 NOTE — Progress Notes (Signed)
   01/16/18 1716 01/16/18 1718  Vital Signs  BP (!) 173/79 (!) 176/97  Pulse Rate (!) 50 (!) 54  Dr Garwin Brothers at bedside. Orders rec'd for PO labetalol.  Clarified if IV labetalol desired & MD does not want hypertensive protocol initiated at this time.

## 2018-01-16 NOTE — MAU Note (Signed)
Was at a pre-op visit, BP was elevated.  Returned to Dr Garwin Brothers, sent her for further eval.  Pt denies hx of elevated BP. Slight HA (hasn't eaten since breakfast), denies epigastric pain, visual changes or swelling. baby is doing really good. Vag del 4/21.

## 2018-01-16 NOTE — MAU Note (Signed)
Urine in lab 

## 2018-01-16 NOTE — MAU Provider Note (Signed)
History     Chief Complaint  Patient presents with  . Hypertension   27 yo G1P1 MBF 4 wk pp sent from the office for labs and serial BP. Pt was found to have elev BP 144/100 at preop appt at [facility]. She has been on labetalol 100mg  po bid. Pt had [place] labs done several weeks ago and were normal. Pt reports episode of blurry vision that has since resolved  OB History    Gravida  1   Para  1   Term  1   Preterm      AB      Living  1     SAB      TAB      Ectopic      Multiple  0   Live Births  1           Past Medical History:  Diagnosis Date  . Anemia   . Asthma   . Pneumonia    as a child    Past Surgical History:  Procedure Laterality Date  . FOOT SURGERY Right     Family History  Problem Relation Age of Onset  . Asthma Mother   . Hypertension Mother   . Hypertension Father     Social History   Tobacco Use  . Smoking status: Never Smoker  . Smokeless tobacco: Never Used  Substance Use Topics  . Alcohol use: No  . Drug use: No    Allergies: No Known Allergies  Medications Prior to Admission  Medication Sig Dispense Refill Last Dose  . ibuprofen (ADVIL,MOTRIN) 600 MG tablet Take 1 tablet (600 mg total) by mouth every 6 (six) hours. 30 tablet 0 Past Month at Unknown time  . labetalol (NORMODYNE) 100 MG tablet Take 100 mg by mouth 2 (two) times daily.   01/16/2018 at 0900  . magnesium hydroxide (MILK OF MAGNESIA) 400 MG/5ML suspension Take 30 mLs by mouth daily. 360 mL 0 Past Week at Unknown time  . mineral oil liquid Take by mouth daily. 180 mL 3 Past Week at Unknown time  . Prenatal Vit-Fe Fumarate-FA (PRENATAL MULTIVITAMIN) TABS tablet Take 1 tablet by mouth daily at 12 noon.   01/16/2018 at Unknown time  . senna-docusate (SENOKOT-S) 8.6-50 MG tablet Take 2 tablets by mouth daily. 60 tablet 3 Past Week at Unknown time  . albuterol (PROVENTIL HFA;VENTOLIN HFA) 108 (90 BASE) MCG/ACT inhaler Inhale 2 puffs into the lungs every  6 (six) hours as needed. For shortness of breath   Rescue  . albuterol (PROVENTIL) (2.5 MG/3ML) 0.083% nebulizer solution Take 2.5 mg by nebulization every 6 (six) hours as needed for wheezing or shortness of breath.    Rescue     Physical Exam   Blood pressure (!) 153/92, pulse 61, temperature 98.2 F (36.8 C), resp. rate 17, weight 71.6 kg (157 lb 12 oz), SpO2 100 %, currently breastfeeding.  No exam performed today, bp EVAL ONLY. ED Course  A) PP HTN  P) increase labetalol to 200 mg po bid . [place] labs.  MDM  addendum CBC Latest Ref Rng & Units 01/16/2018 12/17/2017 12/15/2017  WBC 4.0 - 10.5 K/uL 5.6 16.0(H) 7.4  Hemoglobin 12.0 - 15.0 g/dL 12.8 7.6(L) 10.7(L)  Hematocrit 36.0 - 46.0 % 39.2 22.4(L) 31.8(L)  Platelets 150 - 400 K/uL 337 202 244   CMP Latest Ref Rng & Units 01/16/2018 02/07/2016 02/20/2014  Glucose 65 - 99 mg/dL 84 100(H) 92  BUN 6 -  20 mg/dL 8 9 10   Creatinine 0.44 - 1.00 mg/dL 0.73 0.66 0.75  Sodium 135 - 145 mmol/L 140 136 140  Potassium 3.5 - 5.1 mmol/L 3.3(L) 4.3 3.9  Chloride 101 - 111 mmol/L 108 106 103  CO2 22 - 32 mmol/L 20(L) 23 25  Calcium 8.9 - 10.3 mg/dL 9.7 9.4 9.6  Total Protein 6.5 - 8.1 g/dL 7.7 6.9 7.3  Total Bilirubin 0.3 - 1.2 mg/dL 0.2(L) 0.3 <0.2(L)  Alkaline Phos 38 - 126 U/L 88 52 97  AST 15 - 41 U/L 23 19 16   ALT 14 - 54 U/L 18 12(L) 10   P) d/c home f/u with PCP dr Thea Silversmith  Marvene Staff, MD 5:14 PM 01/16/2018

## 2018-01-23 ENCOUNTER — Other Ambulatory Visit: Payer: Self-pay

## 2018-01-23 ENCOUNTER — Encounter (HOSPITAL_BASED_OUTPATIENT_CLINIC_OR_DEPARTMENT_OTHER): Payer: Self-pay | Admitting: *Deleted

## 2018-01-23 NOTE — Progress Notes (Signed)
Ensure pre surgery drink given with instructions to complete by 0645 dos, pt verbalized understanding. 

## 2018-01-27 NOTE — H&P (Signed)
   Lauren Thornton Documented: 01/16/2018 2:24 PM Location: [facility] Patient #: [id number] DOB: 03-Oct-1990 Married / Language: English / Race: Black or African American Female   History of Present Illness (Addilyne Backs A. Ninfa Linden MD; 01/16/2018 2:34 PM) The patient is a 27 year old female who presents with a breast mass. This is a pleasant patient referred by Dr. Servando Salina for evaluation of the left breast mass. 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 Becoming pregnant, the mass has increased in size and almost doubling to 4.7 cm on most recent ultrasound. She denies nipple discharge. She is currently breast-feeding. She has less milk from that breast. There is no family history of breast cancer. She is otherwise healthy.   Past Surgical History Levonne Spiller, CMA; 01/16/2018 2:24 PM) No pertinent past surgical history   Diagnostic Studies History Levonne Spiller, CMA; 01/16/2018 2:24 PM) Pap Smear  1-5 years ago  Allergies Andee Poles Gerrigner, [place]; 01/16/2018 2:25 PM) No Known Allergies [01/16/2018]: Allergies Reconciled   Medication History Levonne Spiller, CMA; 01/16/2018 2:25 PM) Labetalol HCl (100MG  Tablet, Oral) Active. Prenatal Vitamin (Oral) Specific strength unknown - Active. Medications Reconciled  Social History Andee Poles Education officer, [employer], CMA; 01/16/2018 2:24 PM) No alcohol use  No caffeine use  No drug use  Tobacco use  Never smoker.  Family History Levonne Spiller, CMA; 01/16/2018 2:24 PM) Hypertension  Father, Mother.  Pregnancy / Birth History Levonne Spiller, CMA; 01/16/2018 2:24 PM) Age at menarche  76 years. Gravida  1 Maternal age  44-25 Para  1 Regular periods   Other Problems Levonne Spiller, CMA; 01/16/2018 2:24 PM) Asthma  Other disease, cancer, significant illness     Review of Systems Andee Poles Gerrigner CMA; 01/16/2018 2:24 PM) HEENT  Present- Wears glasses/contact lenses. Not Present- Earache, Hearing Loss, Hoarseness, Nose Bleed, Oral Ulcers, Ringing in the Ears, Seasonal Allergies, Sinus Pain, Sore Throat, Visual Disturbances and Yellow Eyes. Breast Present- Breast Mass. Not Present- Breast Pain, Nipple Discharge and Skin Changes.  Vitals (Danielle Gerrigner CMA; 01/16/2018 2:25 PM) 01/16/2018 2:25 PM Weight: 158.25 lb Height: 61in Body Surface Area: 1.71 m Body Mass Index: 29.9 kg/m  Temp.: 98.27F(Oral)  Pulse: 86 (Regular)  BP: 144/100 (Sitting, Left Arm, Standard)       Physical Exam (Miah Boye A. Ninfa Linden MD; 01/16/2018 2:34 PM) The physical exam findings are as follows: Note:On examination, there is an almost 5 cm mass in the left breast which is mobile. It is at the 8:30 o'clock position. There is no axillary adenopathy. The nipple areolar complex is normal. Lungs clear bilaterally Cardiovascular is regular rate and rhythm    Assessment & Plan (Paullette Mckain A. Ninfa Linden MD; 01/16/2018 2:35 PM) LEFT BREAST MASS (N63.20) Impression: This is an enlarging fibroadenoma. As it has quickly become larger and is causing discomfort, a left breast lumpectomy is recommended and she is here to proceed with surgery. We discussed the surgical procedure in detail. We discussed the risks which includes but is not limited to bleeding, infection, injury to surrounding structures, postoperative recovery, cardiopulmonary issues with anesthesia, etc. She understands and surgery will be scheduled

## 2018-01-28 ENCOUNTER — Ambulatory Visit (HOSPITAL_BASED_OUTPATIENT_CLINIC_OR_DEPARTMENT_OTHER)
Admission: RE | Admit: 2018-01-28 | Discharge: 2018-01-28 | Disposition: A | Discharge status: Home / Self Care | Payer: BLUE CROSS/BLUE SHIELD | Source: Ambulatory Visit | Attending: Surgery | Admitting: Surgery

## 2018-01-28 ENCOUNTER — Encounter (HOSPITAL_BASED_OUTPATIENT_CLINIC_OR_DEPARTMENT_OTHER): Payer: Self-pay | Admitting: Certified Registered"

## 2018-01-28 ENCOUNTER — Other Ambulatory Visit: Payer: Self-pay

## 2018-01-28 ENCOUNTER — Ambulatory Visit (HOSPITAL_BASED_OUTPATIENT_CLINIC_OR_DEPARTMENT_OTHER): Payer: BLUE CROSS/BLUE SHIELD | Admitting: Certified Registered"

## 2018-01-28 ENCOUNTER — Encounter (HOSPITAL_BASED_OUTPATIENT_CLINIC_OR_DEPARTMENT_OTHER)
Admission: RE | Disposition: A | Discharge status: Home / Self Care | Payer: Self-pay | Source: Ambulatory Visit | Attending: Surgery

## 2018-01-28 DIAGNOSIS — J45909 Unspecified asthma, uncomplicated: Secondary | ICD-10-CM | POA: Diagnosis not present

## 2018-01-28 DIAGNOSIS — D242 Benign neoplasm of left breast: Secondary | ICD-10-CM | POA: Diagnosis not present

## 2018-01-28 DIAGNOSIS — N63 Unspecified lump in unspecified breast: Secondary | ICD-10-CM | POA: Diagnosis present

## 2018-01-28 HISTORY — PX: BREAST LUMPECTOMY: SHX2

## 2018-01-28 HISTORY — DX: Unspecified lump in unspecified breast: N63.0

## 2018-01-28 SURGERY — BREAST LUMPECTOMY
Anesthesia: General | Site: Breast | Laterality: Left

## 2018-01-28 MED ORDER — BUPIVACAINE-EPINEPHRINE 0.5% -1:200000 IJ SOLN
INTRAMUSCULAR | Status: DC | PRN
  Administered 2018-01-28: 20 mL

## 2018-01-28 MED ORDER — DEXAMETHASONE SODIUM PHOSPHATE 4 MG/ML IJ SOLN
INTRAMUSCULAR | Status: DC | PRN
  Administered 2018-01-28: 6 mg via INTRAVENOUS

## 2018-01-28 MED ORDER — CEFAZOLIN SODIUM-DEXTROSE 2-4 GM/100ML-% IV SOLN
INTRAVENOUS | Status: AC
  Filled 2018-01-28: qty 100

## 2018-01-28 MED ORDER — EPHEDRINE SULFATE 50 MG/ML IJ SOLN
INTRAMUSCULAR | Status: AC
  Filled 2018-01-28: qty 1

## 2018-01-28 MED ORDER — FENTANYL CITRATE (PF) 100 MCG/2ML IJ SOLN
50.0000 ug | INTRAMUSCULAR | Status: DC | PRN
  Administered 2018-01-28: 100 ug via INTRAVENOUS

## 2018-01-28 MED ORDER — CELECOXIB 200 MG PO CAPS
200.0000 mg | ORAL_CAPSULE | ORAL | Status: AC
  Administered 2018-01-28: 200 mg via ORAL

## 2018-01-28 MED ORDER — HYDROMORPHONE HCL 1 MG/ML IJ SOLN: 0.2500 mg | INTRAMUSCULAR | Status: DC | PRN

## 2018-01-28 MED ORDER — PROPOFOL 10 MG/ML IV BOLUS
INTRAVENOUS | Status: DC | PRN
  Administered 2018-01-28: 200 mg via INTRAVENOUS

## 2018-01-28 MED ORDER — SCOPOLAMINE 1 MG/3DAYS TD PT72: 1.0000 | MEDICATED_PATCH | Freq: Once | TRANSDERMAL | Status: DC | PRN

## 2018-01-28 MED ORDER — MIDAZOLAM HCL 2 MG/2ML IJ SOLN
1.0000 mg | INTRAMUSCULAR | Status: DC | PRN
  Administered 2018-01-28: 2 mg via INTRAVENOUS

## 2018-01-28 MED ORDER — MIDAZOLAM HCL 2 MG/2ML IJ SOLN
INTRAMUSCULAR | Status: AC
  Filled 2018-01-28: qty 2

## 2018-01-28 MED ORDER — ONDANSETRON HCL 4 MG/2ML IJ SOLN
INTRAMUSCULAR | Status: AC
  Filled 2018-01-28: qty 12

## 2018-01-28 MED ORDER — ACETAMINOPHEN 500 MG PO TABS
ORAL_TABLET | ORAL | Status: AC
  Filled 2018-01-28: qty 2

## 2018-01-28 MED ORDER — PROPOFOL 500 MG/50ML IV EMUL
INTRAVENOUS | Status: AC
  Filled 2018-01-28: qty 50

## 2018-01-28 MED ORDER — PROMETHAZINE HCL 25 MG/ML IJ SOLN: 6.2500 mg | INTRAMUSCULAR | Status: DC | PRN

## 2018-01-28 MED ORDER — DEXAMETHASONE SODIUM PHOSPHATE 10 MG/ML IJ SOLN
INTRAMUSCULAR | Status: AC
  Filled 2018-01-28: qty 3

## 2018-01-28 MED ORDER — OXYCODONE HCL 5 MG PO TABS: 5.0000 mg | ORAL_TABLET | Freq: Once | ORAL | Status: DC | PRN

## 2018-01-28 MED ORDER — ONDANSETRON HCL 4 MG/2ML IJ SOLN
INTRAMUSCULAR | Status: DC | PRN
  Administered 2018-01-28: 4 mg via INTRAVENOUS

## 2018-01-28 MED ORDER — LIDOCAINE HCL (CARDIAC) PF 100 MG/5ML IV SOSY
PREFILLED_SYRINGE | INTRAVENOUS | Status: AC
  Filled 2018-01-28: qty 20

## 2018-01-28 MED ORDER — MEPERIDINE HCL 25 MG/ML IJ SOLN: 6.2500 mg | INTRAMUSCULAR | Status: DC | PRN

## 2018-01-28 MED ORDER — GABAPENTIN 300 MG PO CAPS
ORAL_CAPSULE | ORAL | Status: AC
  Filled 2018-01-28: qty 1

## 2018-01-28 MED ORDER — CHLORHEXIDINE GLUCONATE CLOTH 2 % EX PADS: 6.0000 | MEDICATED_PAD | Freq: Once | CUTANEOUS | Status: DC

## 2018-01-28 MED ORDER — CEFAZOLIN SODIUM-DEXTROSE 2-4 GM/100ML-% IV SOLN
2.0000 g | INTRAVENOUS | Status: AC
  Administered 2018-01-28: 2 g via INTRAVENOUS

## 2018-01-28 MED ORDER — GABAPENTIN 300 MG PO CAPS
300.0000 mg | ORAL_CAPSULE | ORAL | Status: AC
  Administered 2018-01-28: 300 mg via ORAL

## 2018-01-28 MED ORDER — OXYCODONE HCL 5 MG/5ML PO SOLN: 5.0000 mg | Freq: Once | ORAL | Status: DC | PRN

## 2018-01-28 MED ORDER — PHENYLEPHRINE 40 MCG/ML (10ML) SYRINGE FOR IV PUSH (FOR BLOOD PRESSURE SUPPORT)
PREFILLED_SYRINGE | INTRAVENOUS | Status: AC
Start: 2018-01-28 — End: ?
  Filled 2018-01-28: qty 20

## 2018-01-28 MED ORDER — FENTANYL CITRATE (PF) 100 MCG/2ML IJ SOLN
INTRAMUSCULAR | Status: AC
  Filled 2018-01-28: qty 2

## 2018-01-28 MED ORDER — HYDROCODONE-ACETAMINOPHEN 5-325 MG PO TABS: 1.0000 | ORAL_TABLET | Freq: Four times a day (QID) | ORAL | 0 refills | Status: DC | PRN

## 2018-01-28 MED ORDER — ACETAMINOPHEN 500 MG PO TABS
1000.0000 mg | ORAL_TABLET | ORAL | Status: AC
  Administered 2018-01-28: 1000 mg via ORAL

## 2018-01-28 MED ORDER — LACTATED RINGERS IV SOLN
INTRAVENOUS | Status: DC
  Administered 2018-01-28: 11:00:00 via INTRAVENOUS

## 2018-01-28 MED ORDER — CELECOXIB 200 MG PO CAPS
ORAL_CAPSULE | ORAL | Status: AC
  Filled 2018-01-28: qty 1

## 2018-01-28 MED ORDER — LIDOCAINE HCL (CARDIAC) PF 100 MG/5ML IV SOSY
PREFILLED_SYRINGE | INTRAVENOUS | Status: DC | PRN
  Administered 2018-01-28: 60 mg via INTRAVENOUS

## 2018-01-28 MED ORDER — BUPIVACAINE-EPINEPHRINE (PF) 0.5% -1:200000 IJ SOLN
INTRAMUSCULAR | Status: AC
  Filled 2018-01-28: qty 60

## 2018-01-28 SURGICAL SUPPLY — 37 items
ADH SKN CLS APL DERMABOND .7 (GAUZE/BANDAGES/DRESSINGS) ×1
BLADE HEX COATED 2.75 (ELECTRODE) ×2 IMPLANT
BLADE SURG 15 STRL LF DISP TIS (BLADE) ×1 IMPLANT
BLADE SURG 15 STRL SS (BLADE) ×2
CHLORAPREP W/TINT 26ML (MISCELLANEOUS) ×2 IMPLANT
CLIP VESOCCLUDE SM WIDE 6/CT (CLIP) ×1 IMPLANT
COVER BACK TABLE 60X90IN (DRAPES) ×2 IMPLANT
COVER MAYO STAND STRL (DRAPES) ×2 IMPLANT
DERMABOND ADVANCED (GAUZE/BANDAGES/DRESSINGS) ×1
DERMABOND ADVANCED .7 DNX12 (GAUZE/BANDAGES/DRESSINGS) ×1 IMPLANT
DRAPE LAPAROTOMY 100X72 PEDS (DRAPES) ×2 IMPLANT
DRAPE UTILITY XL STRL (DRAPES) ×2 IMPLANT
ELECT REM PT RETURN 9FT ADLT (ELECTROSURGICAL) ×2
ELECTRODE REM PT RTRN 9FT ADLT (ELECTROSURGICAL) ×1 IMPLANT
GAUZE SPONGE 4X4 12PLY STRL LF (GAUZE/BANDAGES/DRESSINGS) ×2 IMPLANT
GLOVE BIO SURGEON STRL SZ 6.5 (GLOVE) ×1 IMPLANT
GLOVE SURG SIGNA 7.5 PF LTX (GLOVE) ×2 IMPLANT
GOWN STRL REUS W/ TWL LRG LVL3 (GOWN DISPOSABLE) ×1 IMPLANT
GOWN STRL REUS W/ TWL XL LVL3 (GOWN DISPOSABLE) ×1 IMPLANT
GOWN STRL REUS W/TWL LRG LVL3 (GOWN DISPOSABLE) ×2
GOWN STRL REUS W/TWL XL LVL3 (GOWN DISPOSABLE) ×2
KIT MARKER MARGIN INK (KITS) ×2 IMPLANT
NDL HYPO 25X1 1.5 SAFETY (NEEDLE) ×1 IMPLANT
NEEDLE HYPO 25X1 1.5 SAFETY (NEEDLE) ×2 IMPLANT
NS IRRIG 1000ML POUR BTL (IV SOLUTION) ×2 IMPLANT
PACK BASIN DAY SURGERY FS (CUSTOM PROCEDURE TRAY) ×2 IMPLANT
PENCIL BUTTON HOLSTER BLD 10FT (ELECTRODE) ×2 IMPLANT
SLEEVE SCD COMPRESS KNEE MED (MISCELLANEOUS) ×1 IMPLANT
SPONGE LAP 4X18 RFD (DISPOSABLE) ×2 IMPLANT
SUT MNCRL AB 4-0 PS2 18 (SUTURE) ×2 IMPLANT
SUT VIC AB 3-0 SH 27 (SUTURE) ×2
SUT VIC AB 3-0 SH 27X BRD (SUTURE) ×1 IMPLANT
SYR CONTROL 10ML LL (SYRINGE) ×2 IMPLANT
TOWEL GREEN STERILE FF (TOWEL DISPOSABLE) ×2 IMPLANT
TOWEL OR NON WOVEN STRL DISP B (DISPOSABLE) ×2 IMPLANT
TUBE CONNECTING 20X1/4 (TUBING) ×1 IMPLANT
YANKAUER SUCT BULB TIP NO VENT (SUCTIONS) ×1 IMPLANT

## 2018-01-28 NOTE — Interval H&P Note (Signed)
History and Physical Interval Note:no change in H and P  01/28/2018 11:48 AM  Lauren Thornton  has presented today for surgery, with the diagnosis of LEFT BREAST MASS  The various methods of treatment have been discussed with the patient and family. After consideration of risks, benefits and other options for treatment, the patient has consented to  Procedure(s): BREAST LUMPECTOMY (Left) as a surgical intervention .  The patient's history has been reviewed, patient examined, no change in status, stable for surgery.  I have reviewed the patient's chart and labs.  Questions were answered to the patient's satisfaction.     [name] A

## 2018-01-28 NOTE — Anesthesia Procedure Notes (Signed)
Procedure Name: LMA Insertion Date/Time: 01/28/2018 12:10 PM Performed by: Signe Colt, CRNA Pre-anesthesia Checklist: Patient identified, Emergency Drugs available, Suction available and Patient being monitored Patient Re-evaluated:Patient Re-evaluated prior to induction Oxygen Delivery Method: Circle system utilized Preoxygenation: Pre-oxygenation with 100% oxygen Induction Type: IV induction Ventilation: Mask ventilation without difficulty LMA: LMA inserted LMA Size: 4.0 Number of attempts: 1 Airway Equipment and Method: Bite block Placement Confirmation: positive ETCO2 Tube secured with: Tape Dental Injury: Teeth and Oropharynx as per pre-operative assessment

## 2018-01-28 NOTE — Anesthesia Postprocedure Evaluation (Signed)
Anesthesia Post Note  Patient: Lauren Thornton  Procedure(s) Performed: LEFT BREAST LUMPECTOMY (Left Breast)     Patient location during evaluation: PACU Anesthesia Type: General Level of consciousness: awake and alert Pain management: pain level controlled Vital Signs Assessment: post-procedure vital signs reviewed and stable Respiratory status: spontaneous breathing, nonlabored ventilation and respiratory function stable Cardiovascular status: blood pressure returned to baseline and stable Postop Assessment: no apparent nausea or vomiting Anesthetic complications: no    Last Vitals:  Vitals:   01/28/18 1315 01/28/18 1345  BP: 131/84 139/90  Pulse: 68 82  Resp: (!) 22 (!) 22  Temp:  36.7 C  SpO2: 99% 99%    Last Pain:  Vitals:   01/28/18 1345  TempSrc:   PainSc: 0-No pain                 Lynda Rainwater

## 2018-01-28 NOTE — Anesthesia Preprocedure Evaluation (Signed)
Anesthesia Evaluation  Patient identified by MRN, date of birth, ID band Patient awake    Reviewed: Allergy & Precautions, H&P , Patient's Chart, lab work & pertinent test results  Airway Mallampati: II  TM Distance: >3 FB Neck ROM: full    Dental  (+) Teeth Intact   Pulmonary asthma ,    breath sounds clear to auscultation       Cardiovascular  Rhythm:regular Rate:Normal     Neuro/Psych    GI/Hepatic   Endo/Other    Renal/GU      Musculoskeletal   Abdominal   Peds  Hematology   Anesthesia Other Findings       Reproductive/Obstetrics                             Anesthesia Physical  Anesthesia Plan  ASA: II  Anesthesia Plan: General   Post-op Pain Management:    Induction: Intravenous  PONV Risk Score and Plan: 3 and Ondansetron, Dexamethasone and Midazolam  Airway Management Planned: LMA  Additional Equipment:   Intra-op Plan:   Post-operative Plan: Extubation in OR  Informed Consent: I have reviewed the patients History and Physical, chart, labs and discussed the procedure including the risks, benefits and alternatives for the proposed anesthesia with the patient or authorized representative who has indicated his/her understanding and acceptance.   Dental Advisory Given  Plan Discussed with: CRNA  Anesthesia Plan Comments:         Anesthesia Quick Evaluation

## 2018-01-28 NOTE — Transfer of Care (Signed)
Immediate Anesthesia Transfer of Care Note  Patient: Lauren Thornton  Procedure(s) Performed: LEFT BREAST LUMPECTOMY (Left Breast)  Patient Location: PACU  Anesthesia Type:General  Level of Consciousness: awake and patient cooperative  Airway & Oxygen Therapy: Patient Spontanous Breathing and Patient connected to face mask oxygen  Post-op Assessment: Report given to RN and Post -op Vital signs reviewed and stable  Post vital signs: Reviewed and stable  Last Vitals:  Vitals Value Taken Time  BP    Temp    Pulse    Resp    SpO2      Last Pain:  Vitals:   01/28/18 1118  TempSrc: Oral  PainSc: 2       Patients Stated Pain Goal: 1 (30/86/57 [id number])  Complications: No apparent anesthesia complications

## 2018-01-28 NOTE — Discharge Instructions (Signed)
[facility] °Office Phone Number [phone] ° °BREAST BIOPSY/ PARTIAL MASTECTOMY: POST OP INSTRUCTIONS ° °Always review your discharge instruction sheet given to you by the facility where your surgery was performed. ° °IF YOU HAVE DISABILITY OR FAMILY LEAVE FORMS, YOU MUST BRING THEM TO THE OFFICE FOR PROCESSING.  DO NOT GIVE THEM TO YOUR DOCTOR. ° °1. A prescription for pain medication may be given to you upon discharge.  Take your pain medication as prescribed, if needed.  If narcotic pain medicine is not needed, then you may take acetaminophen (Tylenol) or ibuprofen (Advil) as needed. °2. Take your usually prescribed medications unless otherwise directed °3. If you need a refill on your pain medication, please contact your pharmacy.  They will contact our office to request authorization.  Prescriptions will not be filled after 5pm or on week-ends. °4. You should eat very light the first 24 hours after surgery, such as soup, crackers, pudding, etc.  Resume your normal diet the day after surgery. °5. Most patients will experience some swelling and bruising in the breast.  Ice packs and a good support bra will help.  Swelling and bruising can take several days to resolve.  °6. It is common to experience some constipation if taking pain medication after surgery.  Increasing fluid intake and taking a stool softener will usually help or prevent this problem from occurring.  A mild laxative (Milk of Magnesia or Miralax) should be taken according to package directions if there are no bowel movements after 48 hours. °7. Unless discharge instructions indicate otherwise, you may remove your bandages 24-48 hours after surgery, and you may shower at that time.  You may have steri-strips (small skin tapes) in place directly over the incision.  These strips should be left on the skin for 7-10 days.  If your surgeon used skin glue on the incision, you may shower in 24 hours.  The glue will flake off over the  next 2-3 weeks.  Any sutures or staples will be removed at the office during your follow-up visit. °8. ACTIVITIES:  You may resume regular daily activities (gradually increasing) beginning the next day.  Wearing a good support bra or sports bra minimizes pain and swelling.  You may have sexual intercourse when it is comfortable. °a. You may drive when you no longer are taking prescription pain medication, you can comfortably wear a seatbelt, and you can safely maneuver your car and apply brakes. °b. RETURN TO WORK:  ______________________________________________________________________________________ °9. You should see your doctor in the office for a follow-up appointment approximately two weeks after your surgery.  Your doctor’s nurse will typically make your follow-up appointment when she calls you with your pathology report.  Expect your pathology report 2-3 business days after your surgery.  You may call to check if you do not hear from us after three days. °10. OTHER INSTRUCTIONS: _______________________________________________________________________________________________ _____________________________________________________________________________________________________________________________________ °_____________________________________________________________________________________________________________________________________ °_____________________________________________________________________________________________________________________________________ ° °WHEN TO CALL YOUR DOCTOR: °1. Fever over 101.0 °2. Nausea and/or vomiting. °3. Extreme swelling or bruising. °4. Continued bleeding from incision. °5. Increased pain, redness, or drainage from the incision. ° °The clinic staff is available to answer your questions during regular business hours.  Please don’t hesitate to call and ask to speak to one of the nurses for clinical concerns.  If you have a medical emergency, go to the nearest  emergency room or call 911.  A surgeon from [facility] is always on call at the hospital. ° °For further questions, please visit [email or web address]  ° ° ° ° °  Post Anesthesia Home Care Instructions ° °Activity: °Get plenty of rest for the remainder of the day. A responsible individual must stay with you for 24 hours following the procedure.  °For the next 24 hours, DO NOT: °-Drive a car °-Operate machinery °-Drink alcoholic beverages °-Take any medication unless instructed by your physician °-Make any legal decisions or sign important papers. ° °Meals: °Start with liquid foods such as gelatin or soup. Progress to regular foods as tolerated. Avoid greasy, spicy, heavy foods. If nausea and/or vomiting occur, drink only clear liquids until the nausea and/or vomiting subsides. Call your physician if vomiting continues. ° °Special Instructions/Symptoms: °Your throat may feel dry or sore from the anesthesia or the breathing tube placed in your throat during surgery. If this causes discomfort, gargle with warm salt water. The discomfort should disappear within 24 hours. ° °If you had a scopolamine patch placed behind your ear for the management of post- operative nausea and/or vomiting: ° °1. The medication in the patch is effective for 72 hours, after which it should be removed.  Wrap patch in a tissue and discard in the trash. Wash hands thoroughly with soap and water. °2. You may remove the patch earlier than 72 hours if you experience unpleasant side effects which may include dry mouth, dizziness or visual disturbances. °3. Avoid touching the patch. Wash your hands with soap and water after contact with the patch. °  ° °

## 2018-01-28 NOTE — Op Note (Signed)
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  She was recently pregnant and is now delivered.  The fibroadenoma doubled in size to greater than 5 cm in the last several months therefore decision was made to proceed with excision  Procedure: The patient was brought to the operating room and identified as correct patient.  She was placed supine on the operating table and general anesthesia was induced.  Her left breast was prepped and draped in usual sterile fashion.  I anesthetized the inner edge of the areola with Marcaine.  I then made a circumareolar incision with a scalpel.  I then dissected down to the large breast mass which was easily identified and excised in its entirety with the cautery.  Hemostasis was then achieved with the cautery.  The mass was sent to pathology for evaluation.  I then closed the subcutaneous tissue with interrupted 3-0 Vicryl sutures and closed the skin with a running 4-0 Monocryl.  Dermabond was then applied.  The patient tolerated the procedure well.  All the counts were correct at the end of the procedure.  The patient was then extubated in the operating room and taken in a stable condition to the recovery room.          [name]   Date: 01/28/2018  Time: 12:39 PM

## 2018-01-29 ENCOUNTER — Encounter (HOSPITAL_BASED_OUTPATIENT_CLINIC_OR_DEPARTMENT_OTHER): Payer: Self-pay | Admitting: Surgery

## 2018-04-30 ENCOUNTER — Ambulatory Visit: Payer: BLUE CROSS/BLUE SHIELD

## 2018-08-31 ENCOUNTER — Inpatient Hospital Stay (HOSPITAL_COMMUNITY)
Admission: AD | Admit: 2018-08-31 | Discharge: 2018-08-31 | Disposition: A | Discharge status: Home / Self Care | Payer: BLUE CROSS/BLUE SHIELD | Attending: Obstetrics & Gynecology | Admitting: Obstetrics & Gynecology

## 2018-08-31 ENCOUNTER — Encounter (HOSPITAL_COMMUNITY): Payer: Self-pay | Admitting: *Deleted

## 2018-08-31 ENCOUNTER — Inpatient Hospital Stay (HOSPITAL_COMMUNITY): Payer: BLUE CROSS/BLUE SHIELD

## 2018-08-31 DIAGNOSIS — O26891 Other specified pregnancy related conditions, first trimester: Secondary | ICD-10-CM | POA: Diagnosis not present

## 2018-08-31 DIAGNOSIS — O209 Hemorrhage in early pregnancy, unspecified: Secondary | ICD-10-CM | POA: Diagnosis not present

## 2018-08-31 DIAGNOSIS — Z3491 Encounter for supervision of normal pregnancy, unspecified, first trimester: Secondary | ICD-10-CM

## 2018-08-31 DIAGNOSIS — Z6790 Unspecified blood type, Rh positive: Secondary | ICD-10-CM

## 2018-08-31 DIAGNOSIS — Z3A01 Less than 8 weeks gestation of pregnancy: Secondary | ICD-10-CM | POA: Diagnosis not present

## 2018-08-31 LAB — URINALYSIS, ROUTINE W REFLEX MICROSCOPIC
Bilirubin Urine: NEGATIVE
GLUCOSE, UA: NEGATIVE mg/dL
Ketones, ur: 5 mg/dL — AB
Nitrite: NEGATIVE
PROTEIN: NEGATIVE mg/dL
SPECIFIC GRAVITY, URINE: 1.002 — AB (ref 1.005–1.030)
pH: 6 (ref 5.0–8.0)

## 2018-08-31 LAB — CBC
HCT: 38.2 % (ref 36.0–46.0)
HEMOGLOBIN: 13 g/dL (ref 12.0–15.0)
MCH: 28.8 pg (ref 26.0–34.0)
MCHC: 34 g/dL (ref 30.0–36.0)
MCV: 84.5 fL (ref 80.0–100.0)
Platelets: 341 10*3/uL (ref 150–400)
RBC: 4.52 MIL/uL (ref 3.87–5.11)
RDW: 12.9 % (ref 11.5–15.5)
WBC: 4 10*3/uL (ref 4.0–10.5)
nRBC: 0 % (ref 0.0–0.2)

## 2018-08-31 LAB — WET PREP, GENITAL
CLUE CELLS WET PREP: NONE SEEN
Sperm: NONE SEEN
TRICH WET PREP: NONE SEEN
YEAST WET PREP: NONE SEEN

## 2018-08-31 LAB — POCT PREGNANCY, URINE: PREG TEST UR: POSITIVE — AB

## 2018-08-31 LAB — HCG, QUANTITATIVE, PREGNANCY: hCG, Beta Chain, Quant, S: 9294 m[IU]/mL — ABNORMAL HIGH (ref <5)

## 2018-08-31 NOTE — MAU Provider Note (Signed)
Chief Complaint: Abdominal Pain and Vaginal Bleeding   First Provider Initiated Contact with Patient 08/31/18 1058      SUBJECTIVE HPI: Lauren Thornton is a 28 y.o. G1P1001 at Unknown by LMP who presents to maternity admissions reporting vaginal bleeding since last night. The bleeding is light, red, only when wiping.  There are no other symptoms.  She denies any pain. There are no other associated symptoms. She has not tried any treatments.  She had hcg in the office x 2 with an appropriate rise in hcg from 1000 to 2400 in 48 hours per the pt.  She has not yet had an ultrasound.    HPI  Past Medical History:  Diagnosis Date  . Anemia   . Asthma   . Breast mass left  . Pneumonia    as a child  . SVD (spontaneous vaginal delivery) 12/16/2017   Past Surgical History:  Procedure Laterality Date  . BREAST LUMPECTOMY Left 01/28/2018   Procedure: LEFT BREAST LUMPECTOMY;  Surgeon: Coralie Keens, MD;  Location: [facility];  Service: General;  Laterality: Left;  . FOOT SURGERY Right    Social History   Socioeconomic History  . Marital status: Married    Spouse name: Not on file  . Number of children: Not on file  . Years of education: Not on file  . Highest education level: Not on file  Occupational History  . Occupation: Ship broker   Social Needs  . Financial resource strain: Not on file  . Food insecurity:    Worry: Not on file    Inability: Not on file  . Transportation needs:    Medical: Not on file    Non-medical: Not on file  Tobacco Use  . Smoking status: Never Smoker  . Smokeless tobacco: Never Used  Substance and Sexual Activity  . Alcohol use: No  . Drug use: No  . Sexual activity: Not Currently    Birth control/protection: None  Lifestyle  . Physical activity:    Days per week: Not on file    Minutes per session: Not on file  . Stress: Not on file  Relationships  . Social connections:    Talks on phone: Not on file    Gets together: Not on file     Attends religious service: Not on file    Active member of club or organization: Not on file    Attends meetings of clubs or organizations: Not on file    Relationship status: Not on file  . Intimate partner violence:    Fear of current or ex partner: Not on file    Emotionally abused: Not on file    Physically abused: Not on file    Forced sexual activity: Not on file  Other Topics Concern  . Not on file  Social History Narrative  . Not on file   No current facility-administered medications on file prior to encounter.    Current Outpatient Medications on File Prior to Encounter  Medication Sig Dispense Refill  . albuterol (PROVENTIL HFA;VENTOLIN HFA) 108 (90 BASE) MCG/ACT inhaler Inhale 2 puffs into the lungs every 6 (six) hours as needed. For shortness of breath    . albuterol (PROVENTIL) (2.5 MG/3ML) 0.083% nebulizer solution Take 2.5 mg by nebulization every 6 (six) hours as needed for wheezing or shortness of breath.     . magnesium hydroxide (MILK OF MAGNESIA) 400 MG/5ML suspension Take 30 mLs by mouth daily. 360 mL 0  . Prenatal Vit-Fe  Fumarate-FA (PRENATAL MULTIVITAMIN) TABS tablet Take 1 tablet by mouth daily at 12 noon.     No Known Allergies  ROS:  Review of Systems  Constitutional: Negative for chills, fatigue and fever.  Respiratory: Negative for shortness of breath.   Cardiovascular: Negative for chest pain.  Gastrointestinal: Negative for nausea and vomiting.  Genitourinary: Positive for vaginal bleeding. Negative for difficulty urinating, dysuria, flank pain, pelvic pain, vaginal discharge and vaginal pain.  Neurological: Negative for dizziness and headaches.  Psychiatric/Behavioral: Negative.      I have reviewed patient's Past Medical Hx, Surgical Hx, Family Hx, Social Hx, medications and allergies.   Physical Exam   Patient Vitals for the past 24 hrs:  BP Temp Temp src Pulse Resp SpO2 Weight  08/31/18 1550 126/83 - - 93 - - -  08/31/18 1049 133/69 -  - 95 - - -  08/31/18 1037 134/77 97.9 F (36.6 C) Oral 94 17 100 % 64 kg   Constitutional: Well-developed, well-nourished female in no acute distress.  Cardiovascular: normal rate Respiratory: normal effort GI: Abd soft, non-tender. Pos BS x 4 MS: Extremities nontender, no edema, normal ROM Neurologic: Alert and oriented x 4.  GU: Neg CVAT.  PELVIC EXAM: Cervix pink, visually closed, without lesion, scant white creamy discharge, vaginal walls and external genitalia normal Bimanual exam: Cervix 0/long/high, firm, anterior, neg CMT, uterus nontender, nonenlarged, adnexa without tenderness, enlargement, or mass   LAB RESULTS Results for orders placed or performed during the hospital encounter of 08/31/18 (from the past 24 hour(s))  Urinalysis, Routine w reflex microscopic     Status: Abnormal   Collection Time: 08/31/18 10:43 AM  Result Value Ref Range   Color, Urine STRAW (A) YELLOW   APPearance CLEAR CLEAR   Specific Gravity, Urine 1.002 (L) 1.005 - 1.030   pH 6.0 5.0 - 8.0   Glucose, UA NEGATIVE NEGATIVE mg/dL   Hgb urine dipstick MODERATE (A) NEGATIVE   Bilirubin Urine NEGATIVE NEGATIVE   Ketones, ur 5 (A) NEGATIVE mg/dL   Protein, ur NEGATIVE NEGATIVE mg/dL   Nitrite NEGATIVE NEGATIVE   Leukocytes, UA TRACE (A) NEGATIVE   RBC / HPF 0-5 0 - 5 RBC/hpf   WBC, UA 0-5 0 - 5 WBC/hpf   Bacteria, UA RARE (A) NONE SEEN   Squamous Epithelial / LPF 0-5 0 - 5  Pregnancy, urine POC     Status: Abnormal   Collection Time: 08/31/18 10:44 AM  Result Value Ref Range   Preg Test, Ur POSITIVE (A) NEGATIVE  Wet prep, genital     Status: Abnormal   Collection Time: 08/31/18 11:07 AM  Result Value Ref Range   Yeast Wet Prep HPF POC NONE SEEN NONE SEEN   Trich, Wet Prep NONE SEEN NONE SEEN   Clue Cells Wet Prep HPF POC NONE SEEN NONE SEEN   WBC, Wet Prep HPF POC MODERATE (A) NONE SEEN   Sperm NONE SEEN   CBC     Status: None   Collection Time: 08/31/18 12:22 PM  Result Value Ref Range    WBC 4.0 4.0 - 10.5 K/uL   RBC 4.52 3.87 - 5.11 MIL/uL   Hemoglobin 13.0 12.0 - 15.0 g/dL   HCT 38.2 36.0 - 46.0 %   MCV 84.5 80.0 - 100.0 fL   MCH 28.8 26.0 - 34.0 pg   MCHC 34.0 30.0 - 36.0 g/dL   RDW 12.9 11.5 - 15.5 %   Platelets 341 150 - 400 K/uL   nRBC 0.0  0.0 - 0.2 %  hCG, quantitative, pregnancy     Status: Abnormal   Collection Time: 08/31/18 12:22 PM  Result Value Ref Range   hCG, Beta Chain, Quant, S 9,294 (H) <5 mIU/mL    --/--/O POS, O POS Performed at [facility], [address], [place], [place] [address]  [phone])  IMAGING US Ob Less Than 14 Weeks With Ob Transvaginal  Result Date: 08/31/2018 CLINICAL DATA:  Early pregnancy with vaginal bleeding. EXAM: OBSTETRIC <14 WK [place] AND TRANSVAGINAL OB US TECHNIQUE: Both transabdominal and transvaginal ultrasound examinations were performed for complete evaluation of the gestation as well as the maternal uterus, adnexal regions, and pelvic cul-de-sac. Transvaginal technique was performed to assess early pregnancy. COMPARISON:  04/22/2017 FINDINGS: Intrauterine gestational sac: Present.  Single. Yolk sac:  Present Embryo:  Present Cardiac Activity: Not visible at this size. MSD: 8.6 mm   5 w   4 d Subchorionic hemorrhage:  None visualized. Maternal uterus/adnexae: Corpus luteum cyst on the right measuring 2.4 x 1.6 x 1.9 cm. Ovaries otherwise appear normal. The uterus shows the presence of a subserosal fibroid in the posterior body measuring 1.5 x 1 x 1 cm. IMPRESSION: Normal appearing early intrauterine pregnancy with a single gestational sac containing a yolk sac and embryo. By mean sac diameter, gestational age is 5 weeks 4 days. No sign of subchorionic hemorrhage. Small subserosal posterior body fibroid. Electronically Signed   By: Nelson Chimes M.D.   On: 08/31/2018 14:17    MAU Management/MDM: Ordered labs and [place] and reviewed results.  IUP noted on today's [place], [redacted]w[redacted]d without visible FHR. Discussed these results with pt as  normal findings at [redacted]w[redacted]d.  Pt to make appointment with Dr Garwin Brothers as planned for follow up. Return to MAU as needed for emergencies.  Pt discharged with strict return precautions.  ASSESSMENT 1. Blood type, Rh positive   2. Vaginal bleeding in pregnancy, first trimester   3. Normal IUP (intrauterine pregnancy) on prenatal ultrasound, first trimester     PLAN Discharge home Allergies as of 08/31/2018   No Known Allergies     Medication List    STOP taking these medications   HYDROcodone-acetaminophen 5-325 MG tablet Commonly known as:  NORCO   ibuprofen 600 MG tablet Commonly known as:  ADVIL,MOTRIN   labetalol 100 MG tablet Commonly known as:  NORMODYNE   mineral oil liquid   senna-docusate 8.6-50 MG tablet Commonly known as:  Senokot-S     TAKE these medications   albuterol 108 (90 Base) MCG/ACT inhaler Commonly known as:  PROVENTIL HFA;VENTOLIN HFA Inhale 2 puffs into the lungs every 6 (six) hours as needed. For shortness of breath   albuterol (2.5 MG/3ML) 0.083% nebulizer solution Commonly known as:  PROVENTIL Take 2.5 mg by nebulization every 6 (six) hours as needed for wheezing or shortness of breath.   magnesium hydroxide 400 MG/5ML suspension Commonly known as:  MILK OF MAGNESIA Take 30 mLs by mouth daily.   prenatal multivitamin Tabs tablet Take 1 tablet by mouth daily at 12 noon.      Follow-up Information    [facility]yn, [facility] Follow up.   Why:  As scheduled, return to MAU as needed for emergencies. Contact information: [place] [id number] [address] Certified Nurse-Midwife 08/31/2018  4:27 PM

## 2018-08-31 NOTE — MAU Note (Signed)
Lauren Thornton is a 28 y.o.  here in MAU reporting: +vaginal bleeding. Red in color. initally reports she needed a pad but states is only when wipes currently. Called [facility] and they told her to come in LMP: unknown Onset of complaint: yesterday night Pain score: denies Vitals:   08/31/18 1037  BP: 134/77  Pulse: 94  Resp: 17  Temp: 97.9 F (36.6 C)  SpO2: 100%      Lab orders placed from triage: ua and pregnancy

## 2018-09-01 LAB — HIV ANTIBODY (ROUTINE TESTING W REFLEX): HIV SCREEN 4TH GENERATION: NONREACTIVE

## 2018-09-02 ENCOUNTER — Other Ambulatory Visit: Payer: Self-pay | Admitting: Obstetrics and Gynecology

## 2018-09-02 LAB — GC/CHLAMYDIA PROBE AMP (~~LOC~~) NOT AT ARMC
Chlamydia: NEGATIVE
Neisseria Gonorrhea: NEGATIVE

## 2018-09-23 ENCOUNTER — Other Ambulatory Visit: Payer: Self-pay

## 2018-09-23 ENCOUNTER — Encounter (HOSPITAL_COMMUNITY): Payer: Self-pay

## 2018-09-23 ENCOUNTER — Inpatient Hospital Stay (HOSPITAL_COMMUNITY)
Admission: AD | Admit: 2018-09-23 | Discharge: 2018-09-23 | Disposition: A | Discharge status: Home / Self Care | Payer: BLUE CROSS/BLUE SHIELD | Attending: Obstetrics and Gynecology | Admitting: Obstetrics and Gynecology

## 2018-09-23 DIAGNOSIS — Z3A01 Less than 8 weeks gestation of pregnancy: Secondary | ICD-10-CM | POA: Diagnosis not present

## 2018-09-23 DIAGNOSIS — O210 Mild hyperemesis gravidarum: Secondary | ICD-10-CM | POA: Diagnosis present

## 2018-09-23 DIAGNOSIS — J09X2 Influenza due to identified novel influenza A virus with other respiratory manifestations: Secondary | ICD-10-CM | POA: Diagnosis not present

## 2018-09-23 DIAGNOSIS — O99511 Diseases of the respiratory system complicating pregnancy, first trimester: Secondary | ICD-10-CM | POA: Insufficient documentation

## 2018-09-23 DIAGNOSIS — J101 Influenza due to other identified influenza virus with other respiratory manifestations: Secondary | ICD-10-CM

## 2018-09-23 LAB — URINALYSIS, MICROSCOPIC (REFLEX)

## 2018-09-23 LAB — URINALYSIS, ROUTINE W REFLEX MICROSCOPIC
Bilirubin Urine: NEGATIVE
GLUCOSE, UA: NEGATIVE mg/dL
Hgb urine dipstick: NEGATIVE
Ketones, ur: NEGATIVE mg/dL
Leukocytes, UA: NEGATIVE
Nitrite: NEGATIVE
Protein, ur: 100 mg/dL — AB
Specific Gravity, Urine: >1.03 — ABNORMAL HIGH (ref 1.005–1.030)
pH: 6 (ref 5.0–8.0)

## 2018-09-23 NOTE — MAU Note (Signed)
Presents with c/o N&V.  Unable to keep anything down for >24 hours.  Seen today in Urgent Care, +flu Type A.  Denies VB, had cramping yesterday.

## 2018-09-23 NOTE — Discharge Instructions (Signed)
Use tylenol around the clock for 24 hrs for body ache, oral fluid intake. Use nebulizer

## 2018-09-23 NOTE — MAU Provider Note (Signed)
History     Chief Complaint  Patient presents with  . Emesis  . Nausea  . Shortness of Breath   G2P1 BF @ 8 6/7 weeks sent here by urgent care  after influenza A test positive. Pt had nausea and vomiting. She denies fever or diarrhea. Hx asthma. No other family member ill.   OB History    Gravida  2   Para  1   Term  1   Preterm      AB      Living  1     SAB      TAB      Ectopic      Multiple  0   Live Births  1           Past Medical History:  Diagnosis Date  . Anemia   . Asthma   . Breast mass left  . Pneumonia    as a child  . SVD (spontaneous vaginal delivery) 12/16/2017    Past Surgical History:  Procedure Laterality Date  . BREAST LUMPECTOMY Left 01/28/2018   Procedure: LEFT BREAST LUMPECTOMY;  Surgeon: Coralie Keens, MD;  Location: [facility];  Service: General;  Laterality: Left;  . FOOT SURGERY Right     Family History  Problem Relation Age of Onset  . Asthma Mother   . Hypertension Mother   . Hypertension Father     Social History   Tobacco Use  . Smoking status: Never Smoker  . Smokeless tobacco: Never Used  Substance Use Topics  . Alcohol use: No  . Drug use: No    Allergies: No Known Allergies  Medications Prior to Admission  Medication Sig Dispense Refill Last Dose  . albuterol (PROVENTIL HFA;VENTOLIN HFA) 108 (90 BASE) MCG/ACT inhaler Inhale 2 puffs into the lungs every 6 (six) hours as needed. For shortness of breath   More than a month at Unknown time  . albuterol (PROVENTIL) (2.5 MG/3ML) 0.083% nebulizer solution Take 2.5 mg by nebulization every 6 (six) hours as needed for wheezing or shortness of breath.    More than a month at Unknown time  . magnesium hydroxide (MILK OF MAGNESIA) 400 MG/5ML suspension Take 30 mLs by mouth daily. 360 mL 0 Past Week at Unknown time  . Prenatal Vit-Fe Fumarate-FA (PRENATAL MULTIVITAMIN) TABS tablet Take 1 tablet by mouth daily at 12 noon.   01/27/2018 at Unknown time      Physical Exam   Blood pressure 120/77, pulse 99, temperature 99.1 F (37.3 C), temperature source Oral, height 5\' 1"  (1.549 m), weight 64.4 kg, SpO2 99 %, currently breastfeeding.  General appearance: alert, cooperative and no distress Lungs: course breath sound, (-) wheeze Heart: regular rate and rhythm ED Course  ImP; Influenza A IUP@ 8 6/7 weeks P) u/a. Start tamiflu( script from office). Use her inhaler. Keep hydrated d/c home MDM   Marvene Staff, MD 11:03 AM 09/23/2018

## 2018-10-04 LAB — OB RESULTS CONSOLE ANTIBODY SCREEN: Antibody Screen: NEGATIVE

## 2018-10-04 LAB — OB RESULTS CONSOLE GC/CHLAMYDIA
Chlamydia: NEGATIVE
Gonorrhea: NEGATIVE

## 2018-10-04 LAB — OB RESULTS CONSOLE ABO/RH: RH Type: POSITIVE

## 2018-10-04 LAB — OB RESULTS CONSOLE RPR: RPR: NONREACTIVE

## 2018-10-04 LAB — OB RESULTS CONSOLE HEPATITIS B SURFACE ANTIGEN: Hepatitis B Surface Ag: NEGATIVE

## 2018-10-04 LAB — OB RESULTS CONSOLE RUBELLA ANTIBODY, IGM: Rubella: IMMUNE

## 2018-10-04 LAB — OB RESULTS CONSOLE HIV ANTIBODY (ROUTINE TESTING): HIV: NONREACTIVE

## 2018-12-05 ENCOUNTER — Other Ambulatory Visit (HOSPITAL_COMMUNITY): Payer: Self-pay | Admitting: Obstetrics and Gynecology

## 2018-12-05 ENCOUNTER — Encounter (HOSPITAL_COMMUNITY): Payer: Self-pay

## 2018-12-05 DIAGNOSIS — Z3A18 18 weeks gestation of pregnancy: Secondary | ICD-10-CM

## 2018-12-05 DIAGNOSIS — O283 Abnormal ultrasonic finding on antenatal screening of mother: Secondary | ICD-10-CM

## 2018-12-06 ENCOUNTER — Encounter (HOSPITAL_COMMUNITY): Payer: Self-pay

## 2018-12-06 ENCOUNTER — Ambulatory Visit (HOSPITAL_COMMUNITY): Payer: BLUE CROSS/BLUE SHIELD | Admitting: *Deleted

## 2018-12-06 ENCOUNTER — Ambulatory Visit (HOSPITAL_COMMUNITY)
Admission: RE | Admit: 2018-12-06 | Discharge: 2018-12-06 | Disposition: A | Discharge status: Home / Self Care | Payer: BLUE CROSS/BLUE SHIELD | Source: Ambulatory Visit | Attending: Obstetrics and Gynecology | Admitting: Obstetrics and Gynecology

## 2018-12-06 ENCOUNTER — Other Ambulatory Visit: Payer: Self-pay

## 2018-12-06 ENCOUNTER — Ambulatory Visit (HOSPITAL_COMMUNITY): Payer: BLUE CROSS/BLUE SHIELD

## 2018-12-06 VITALS — Temp 98.5°F

## 2018-12-06 DIAGNOSIS — Z3A18 18 weeks gestation of pregnancy: Secondary | ICD-10-CM | POA: Diagnosis not present

## 2018-12-06 DIAGNOSIS — O283 Abnormal ultrasonic finding on antenatal screening of mother: Secondary | ICD-10-CM | POA: Diagnosis present

## 2018-12-06 DIAGNOSIS — O0990 Supervision of high risk pregnancy, unspecified, unspecified trimester: Secondary | ICD-10-CM | POA: Insufficient documentation

## 2018-12-06 DIAGNOSIS — O43192 Other malformation of placenta, second trimester: Secondary | ICD-10-CM

## 2018-12-06 DIAGNOSIS — Z363 Encounter for antenatal screening for malformations: Secondary | ICD-10-CM

## 2018-12-06 DIAGNOSIS — O359XX0 Maternal care for (suspected) fetal abnormality and damage, unspecified, not applicable or unspecified: Secondary | ICD-10-CM | POA: Diagnosis not present

## 2018-12-06 DIAGNOSIS — O4402 Placenta previa specified as without hemorrhage, second trimester: Secondary | ICD-10-CM | POA: Diagnosis not present

## 2018-12-13 ENCOUNTER — Telehealth (HOSPITAL_COMMUNITY): Payer: Self-pay | Admitting: *Deleted

## 2018-12-13 ENCOUNTER — Other Ambulatory Visit (HOSPITAL_COMMUNITY): Payer: Self-pay | Admitting: Obstetrics and Gynecology

## 2018-12-13 NOTE — Telephone Encounter (Signed)
Called with Panorama results.  LM for pt to return call to [facility].  Will fax results to Dr. Garwin Brothers.

## 2018-12-13 NOTE — Telephone Encounter (Signed)
Pt returned call, name and DOB verified.  Normal panorama result given.  Pt voiced understanding.

## 2018-12-16 ENCOUNTER — Ambulatory Visit (HOSPITAL_COMMUNITY): Payer: BLUE CROSS/BLUE SHIELD

## 2019-02-19 ENCOUNTER — Other Ambulatory Visit (HOSPITAL_COMMUNITY): Payer: Self-pay | Admitting: Obstetrics and Gynecology

## 2019-02-19 DIAGNOSIS — Z3A30 30 weeks gestation of pregnancy: Secondary | ICD-10-CM

## 2019-02-19 DIAGNOSIS — O26843 Uterine size-date discrepancy, third trimester: Secondary | ICD-10-CM

## 2019-02-19 DIAGNOSIS — Z362 Encounter for other antenatal screening follow-up: Secondary | ICD-10-CM

## 2019-02-20 ENCOUNTER — Ambulatory Visit (HOSPITAL_COMMUNITY): Payer: BC Managed Care – PPO | Admitting: *Deleted

## 2019-02-20 ENCOUNTER — Encounter (HOSPITAL_COMMUNITY): Payer: Self-pay | Admitting: *Deleted

## 2019-02-20 ENCOUNTER — Ambulatory Visit (HOSPITAL_COMMUNITY)
Admission: RE | Admit: 2019-02-20 | Discharge: 2019-02-20 | Disposition: A | Discharge status: Home / Self Care | Payer: BC Managed Care – PPO | Source: Ambulatory Visit | Attending: Obstetrics and Gynecology | Admitting: Obstetrics and Gynecology

## 2019-02-20 ENCOUNTER — Other Ambulatory Visit: Payer: Self-pay

## 2019-02-20 ENCOUNTER — Other Ambulatory Visit (HOSPITAL_COMMUNITY): Payer: Self-pay | Admitting: *Deleted

## 2019-02-20 ENCOUNTER — Other Ambulatory Visit (HOSPITAL_COMMUNITY): Payer: Self-pay | Admitting: Obstetrics and Gynecology

## 2019-02-20 VITALS — Temp 97.8°F

## 2019-02-20 DIAGNOSIS — O43199 Other malformation of placenta, unspecified trimester: Secondary | ICD-10-CM | POA: Diagnosis present

## 2019-02-20 DIAGNOSIS — Z362 Encounter for other antenatal screening follow-up: Secondary | ICD-10-CM | POA: Diagnosis not present

## 2019-02-20 DIAGNOSIS — O365990 Maternal care for other known or suspected poor fetal growth, unspecified trimester, not applicable or unspecified: Secondary | ICD-10-CM

## 2019-02-20 DIAGNOSIS — O26843 Uterine size-date discrepancy, third trimester: Secondary | ICD-10-CM

## 2019-02-20 DIAGNOSIS — Z3A30 30 weeks gestation of pregnancy: Secondary | ICD-10-CM

## 2019-02-20 DIAGNOSIS — O359XX0 Maternal care for (suspected) fetal abnormality and damage, unspecified, not applicable or unspecified: Secondary | ICD-10-CM | POA: Diagnosis not present

## 2019-02-20 DIAGNOSIS — O4403 Placenta previa specified as without hemorrhage, third trimester: Secondary | ICD-10-CM

## 2019-02-20 DIAGNOSIS — Z3A29 29 weeks gestation of pregnancy: Secondary | ICD-10-CM

## 2019-02-20 DIAGNOSIS — O365931 Maternal care for other known or suspected poor fetal growth, third trimester, fetus 1: Secondary | ICD-10-CM

## 2019-02-20 DIAGNOSIS — Z363 Encounter for antenatal screening for malformations: Secondary | ICD-10-CM | POA: Diagnosis not present

## 2019-02-20 DIAGNOSIS — O43193 Other malformation of placenta, third trimester: Secondary | ICD-10-CM

## 2019-02-24 ENCOUNTER — Encounter (HOSPITAL_COMMUNITY): Payer: Self-pay

## 2019-02-24 ENCOUNTER — Ambulatory Visit (HOSPITAL_COMMUNITY): Payer: BLUE CROSS/BLUE SHIELD

## 2019-02-27 ENCOUNTER — Ambulatory Visit (HOSPITAL_COMMUNITY): Payer: BC Managed Care – PPO | Admitting: *Deleted

## 2019-02-27 ENCOUNTER — Ambulatory Visit (HOSPITAL_COMMUNITY)
Admission: RE | Admit: 2019-02-27 | Discharge: 2019-02-27 | Disposition: A | Discharge status: Home / Self Care | Payer: BC Managed Care – PPO | Source: Ambulatory Visit | Attending: Obstetrics and Gynecology | Admitting: Obstetrics and Gynecology

## 2019-02-27 ENCOUNTER — Encounter (HOSPITAL_COMMUNITY): Payer: Self-pay

## 2019-02-27 ENCOUNTER — Other Ambulatory Visit (HOSPITAL_COMMUNITY): Payer: Self-pay | Admitting: Obstetrics and Gynecology

## 2019-02-27 ENCOUNTER — Other Ambulatory Visit: Payer: Self-pay

## 2019-02-27 ENCOUNTER — Encounter (HOSPITAL_COMMUNITY): Payer: Self-pay | Admitting: Obstetrics and Gynecology

## 2019-02-27 VITALS — BP 109/66 | HR 83 | Temp 97.4°F

## 2019-02-27 DIAGNOSIS — Z3A30 30 weeks gestation of pregnancy: Secondary | ICD-10-CM

## 2019-02-27 DIAGNOSIS — O365990 Maternal care for other known or suspected poor fetal growth, unspecified trimester, not applicable or unspecified: Secondary | ICD-10-CM | POA: Diagnosis not present

## 2019-02-27 DIAGNOSIS — O365931 Maternal care for other known or suspected poor fetal growth, third trimester, fetus 1: Secondary | ICD-10-CM | POA: Diagnosis not present

## 2019-02-27 DIAGNOSIS — O43199 Other malformation of placenta, unspecified trimester: Secondary | ICD-10-CM

## 2019-02-27 DIAGNOSIS — O4403 Placenta previa specified as without hemorrhage, third trimester: Secondary | ICD-10-CM | POA: Diagnosis not present

## 2019-03-06 ENCOUNTER — Other Ambulatory Visit: Payer: Self-pay

## 2019-03-06 ENCOUNTER — Inpatient Hospital Stay (HOSPITAL_COMMUNITY)
Admission: AD | Admit: 2019-03-06 | Discharge: 2019-03-06 | Disposition: A | Discharge status: Home / Self Care | Payer: BC Managed Care – PPO | Attending: Obstetrics and Gynecology | Admitting: Obstetrics and Gynecology

## 2019-03-06 ENCOUNTER — Ambulatory Visit (HOSPITAL_COMMUNITY): Payer: BC Managed Care – PPO | Admitting: *Deleted

## 2019-03-06 ENCOUNTER — Encounter (HOSPITAL_COMMUNITY): Payer: Self-pay | Admitting: *Deleted

## 2019-03-06 ENCOUNTER — Ambulatory Visit (HOSPITAL_BASED_OUTPATIENT_CLINIC_OR_DEPARTMENT_OTHER)
Admission: RE | Admit: 2019-03-06 | Discharge: 2019-03-06 | Disposition: A | Discharge status: Home / Self Care | Payer: BC Managed Care – PPO | Source: Ambulatory Visit | Attending: Obstetrics and Gynecology | Admitting: Obstetrics and Gynecology

## 2019-03-06 VITALS — Temp 97.0°F

## 2019-03-06 DIAGNOSIS — R109 Unspecified abdominal pain: Secondary | ICD-10-CM | POA: Diagnosis present

## 2019-03-06 DIAGNOSIS — O43193 Other malformation of placenta, third trimester: Secondary | ICD-10-CM

## 2019-03-06 DIAGNOSIS — O359XX0 Maternal care for (suspected) fetal abnormality and damage, unspecified, not applicable or unspecified: Secondary | ICD-10-CM | POA: Diagnosis not present

## 2019-03-06 DIAGNOSIS — O26899 Other specified pregnancy related conditions, unspecified trimester: Secondary | ICD-10-CM

## 2019-03-06 DIAGNOSIS — O365990 Maternal care for other known or suspected poor fetal growth, unspecified trimester, not applicable or unspecified: Secondary | ICD-10-CM

## 2019-03-06 DIAGNOSIS — O26893 Other specified pregnancy related conditions, third trimester: Secondary | ICD-10-CM | POA: Diagnosis not present

## 2019-03-06 DIAGNOSIS — O365930 Maternal care for other known or suspected poor fetal growth, third trimester, not applicable or unspecified: Secondary | ICD-10-CM | POA: Diagnosis not present

## 2019-03-06 DIAGNOSIS — N898 Other specified noninflammatory disorders of vagina: Secondary | ICD-10-CM

## 2019-03-06 DIAGNOSIS — Z3A31 31 weeks gestation of pregnancy: Secondary | ICD-10-CM

## 2019-03-06 DIAGNOSIS — Z3A32 32 weeks gestation of pregnancy: Secondary | ICD-10-CM | POA: Insufficient documentation

## 2019-03-06 LAB — WET PREP, GENITAL
Clue Cells Wet Prep HPF POC: NONE SEEN
Sperm: NONE SEEN
Trich, Wet Prep: NONE SEEN
Yeast Wet Prep HPF POC: NONE SEEN

## 2019-03-06 NOTE — MAU Note (Signed)
Pt reports cramping and dc since 4pm today. Reports good fetal movement. Denies vag bleeding

## 2019-03-06 NOTE — MAU Provider Note (Signed)
History     Chief Complaint  Patient presents with  . Rupture of Membranes  . Contractions    G2 P1001 MF @ 32 2/7 wk presents with c/o abdominal cramping and watery vaginal discharge that started this  Pm. Pt had been seen in MFM earlier today for f/u sonogram and had no complaint at that time. Denies urinary sx (+) FM  OB History    Gravida  2   Para  1   Term  1   Preterm      AB      Living  1     SAB      TAB      Ectopic      Multiple  0   Live Births  1           Past Medical History:  Diagnosis Date  . Anemia   . Asthma   . Breast mass left  . Pneumonia    as a child  . SVD (spontaneous vaginal delivery) 12/16/2017    Past Surgical History:  Procedure Laterality Date  . BREAST LUMPECTOMY Left 01/28/2018   Procedure: LEFT BREAST LUMPECTOMY;  Surgeon: Coralie Keens, MD;  Location: [facility];  Service: General;  Laterality: Left;  . FOOT SURGERY Right     Family History  Problem Relation Age of Onset  . Asthma Mother   . Hypertension Mother   . Hypertension Father     Social History   Tobacco Use  . Smoking status: Never Smoker  . Smokeless tobacco: Never Used  Substance Use Topics  . Alcohol use: No  . Drug use: No    Allergies: No Known Allergies  Medications Prior to Admission  Medication Sig Dispense Refill Last Dose  . albuterol (PROVENTIL HFA;VENTOLIN HFA) 108 (90 BASE) MCG/ACT inhaler Inhale 2 puffs into the lungs every 6 (six) hours as needed. For shortness of breath     . albuterol (PROVENTIL) (2.5 MG/3ML) 0.083% nebulizer solution Take 2.5 mg by nebulization every 6 (six) hours as needed for wheezing or shortness of breath.      . magnesium hydroxide (MILK OF MAGNESIA) 400 MG/5ML suspension Take 30 mLs by mouth daily. 360 mL 0   . Prenatal Vit-Fe Fumarate-FA (PRENATAL MULTIVITAMIN) TABS tablet Take 1 tablet by mouth daily at 12 noon.        Physical Exam   Blood pressure (!) 116/57, currently  breastfeeding.  General appearance: alert, cooperative and no distress  Abdomen: gravid soft non tender Pelvic : VE: creamy white discharge. Wet prep done  CX: Long/closed Tracing: baseline 145 (+) accel to 160 no ctx  IMP: vaginal discharge in pregnancy Abdominal cramping in pregnancy P) wet prep done: neg D/c home Daily kick ct. Keep sched OB appts ED Course   MDM   Marvene Staff, MD 7:20 PM 03/06/2019

## 2019-03-06 NOTE — Discharge Instructions (Signed)
Fetal Movement Counts Patient Name: ________________________________________________ Patient Due Date: ____________________ What is a fetal movement count?  A fetal movement count is the number of times that you feel your baby move during a certain amount of time. This may also be called a fetal kick count. A fetal movement count is recommended for every pregnant woman. You may be asked to start counting fetal movements as early as week 28 of your pregnancy. Pay attention to when your baby is most active. You may notice your baby's sleep and wake cycles. You may also notice things that make your baby move more. You should do a fetal movement count:  When your baby is normally most active.  At the same time each day. A good time to count movements is while you are resting, after having something to eat and drink. How do I count fetal movements? 1. Find a quiet, comfortable area. Sit, or lie down on your side. 2. Write down the date, the start time and stop time, and the number of movements that you felt between those two times. Take this information with you to your health care visits. 3. For 2 hours, count kicks, flutters, swishes, rolls, and jabs. You should feel at least 10 movements during 2 hours. 4. You may stop counting after you have felt 10 movements. 5. If you do not feel 10 movements in 2 hours, have something to eat and drink. Then, keep resting and counting for 1 hour. If you feel at least 4 movements during that hour, you may stop counting. Contact a health care provider if:  You feel fewer than 4 movements in 2 hours.  Your baby is not moving like he or she usually does. Date: ____________ Start time: ____________ Stop time: ____________ Movements: ____________ Date: ____________ Start time: ____________ Stop time: ____________ Movements: ____________ Date: ____________ Start time: ____________ Stop time: ____________ Movements: ____________ Date: ____________ Start time:  ____________ Stop time: ____________ Movements: ____________ Date: ____________ Start time: ____________ Stop time: ____________ Movements: ____________ Date: ____________ Start time: ____________ Stop time: ____________ Movements: ____________ Date: ____________ Start time: ____________ Stop time: ____________ Movements: ____________ Date: ____________ Start time: ____________ Stop time: ____________ Movements: ____________ Date: ____________ Start time: ____________ Stop time: ____________ Movements: ____________ This information is not intended to replace advice given to you by your health care provider. Make sure you discuss any questions you have with your health care provider. Document Released: 09/13/2006 Document Revised: 09/03/2018 Document Reviewed: 09/23/2015 Elsevier Patient Education  2020 [employer].

## 2019-03-13 ENCOUNTER — Ambulatory Visit (HOSPITAL_COMMUNITY)
Admission: RE | Admit: 2019-03-13 | Discharge: 2019-03-13 | Disposition: A | Discharge status: Home / Self Care | Payer: BC Managed Care – PPO | Source: Ambulatory Visit | Attending: Obstetrics and Gynecology | Admitting: Obstetrics and Gynecology

## 2019-03-13 ENCOUNTER — Other Ambulatory Visit (HOSPITAL_COMMUNITY): Payer: Self-pay | Admitting: *Deleted

## 2019-03-13 ENCOUNTER — Ambulatory Visit (HOSPITAL_COMMUNITY): Payer: BC Managed Care – PPO | Admitting: *Deleted

## 2019-03-13 ENCOUNTER — Other Ambulatory Visit (HOSPITAL_COMMUNITY): Payer: Self-pay | Admitting: Obstetrics and Gynecology

## 2019-03-13 ENCOUNTER — Other Ambulatory Visit: Payer: Self-pay

## 2019-03-13 ENCOUNTER — Encounter (HOSPITAL_COMMUNITY): Payer: Self-pay

## 2019-03-13 VITALS — BP 107/55 | HR 86 | Temp 98.3°F

## 2019-03-13 DIAGNOSIS — Z364 Encounter for antenatal screening for fetal growth retardation: Secondary | ICD-10-CM

## 2019-03-13 DIAGNOSIS — O365931 Maternal care for other known or suspected poor fetal growth, third trimester, fetus 1: Secondary | ICD-10-CM | POA: Diagnosis not present

## 2019-03-13 DIAGNOSIS — O365990 Maternal care for other known or suspected poor fetal growth, unspecified trimester, not applicable or unspecified: Secondary | ICD-10-CM

## 2019-03-13 DIAGNOSIS — O43193 Other malformation of placenta, third trimester: Secondary | ICD-10-CM

## 2019-03-13 DIAGNOSIS — O359XX0 Maternal care for (suspected) fetal abnormality and damage, unspecified, not applicable or unspecified: Secondary | ICD-10-CM | POA: Diagnosis not present

## 2019-03-13 DIAGNOSIS — Z3A32 32 weeks gestation of pregnancy: Secondary | ICD-10-CM

## 2019-03-13 DIAGNOSIS — O365930 Maternal care for other known or suspected poor fetal growth, third trimester, not applicable or unspecified: Secondary | ICD-10-CM | POA: Diagnosis not present

## 2019-03-13 NOTE — Procedures (Signed)
Lauren Thornton 1991/08/06 [redacted]w[redacted]d  Fetus A Non-Stress Test Interpretation for 03/13/19  Indication: Unsatisfactory BPP  Fetal Heart Rate A Mode: External Baseline Rate (A): 140 bpm Variability: Moderate Accelerations: 15 x 15, 10 x 10 Decelerations: None Multiple birth?: No  Uterine Activity Mode: Palpation, Toco Contraction Frequency (min): none Resting Tone Palpated: Relaxed Resting Time: Adequate  Interpretation (Fetal Testing) Nonstress Test Interpretation: Reactive Comments: Reviewed tracing with Dr. Donalee Citrin

## 2019-03-21 ENCOUNTER — Other Ambulatory Visit: Payer: Self-pay

## 2019-03-21 ENCOUNTER — Encounter (HOSPITAL_COMMUNITY): Payer: Self-pay

## 2019-03-21 ENCOUNTER — Ambulatory Visit (HOSPITAL_COMMUNITY)
Admission: RE | Admit: 2019-03-21 | Discharge: 2019-03-21 | Disposition: A | Discharge status: Home / Self Care | Payer: BC Managed Care – PPO | Source: Ambulatory Visit | Attending: Obstetrics and Gynecology | Admitting: Obstetrics and Gynecology

## 2019-03-21 ENCOUNTER — Ambulatory Visit (HOSPITAL_COMMUNITY): Payer: BC Managed Care – PPO | Admitting: *Deleted

## 2019-03-21 VITALS — BP 112/59 | HR 93 | Temp 98.5°F

## 2019-03-21 DIAGNOSIS — O43193 Other malformation of placenta, third trimester: Secondary | ICD-10-CM

## 2019-03-21 DIAGNOSIS — O365990 Maternal care for other known or suspected poor fetal growth, unspecified trimester, not applicable or unspecified: Secondary | ICD-10-CM | POA: Diagnosis not present

## 2019-03-21 DIAGNOSIS — Z3A33 33 weeks gestation of pregnancy: Secondary | ICD-10-CM

## 2019-03-21 DIAGNOSIS — O359XX0 Maternal care for (suspected) fetal abnormality and damage, unspecified, not applicable or unspecified: Secondary | ICD-10-CM

## 2019-03-21 DIAGNOSIS — O365931 Maternal care for other known or suspected poor fetal growth, third trimester, fetus 1: Secondary | ICD-10-CM

## 2019-03-27 ENCOUNTER — Encounter (HOSPITAL_COMMUNITY): Payer: Self-pay

## 2019-03-27 ENCOUNTER — Ambulatory Visit (HOSPITAL_COMMUNITY): Payer: BC Managed Care – PPO | Admitting: *Deleted

## 2019-03-27 ENCOUNTER — Other Ambulatory Visit (HOSPITAL_COMMUNITY): Payer: Self-pay | Admitting: Obstetrics and Gynecology

## 2019-03-27 ENCOUNTER — Other Ambulatory Visit: Payer: Self-pay

## 2019-03-27 ENCOUNTER — Ambulatory Visit (HOSPITAL_COMMUNITY)
Admission: RE | Admit: 2019-03-27 | Discharge: 2019-03-27 | Disposition: A | Discharge status: Home / Self Care | Payer: BC Managed Care – PPO | Source: Ambulatory Visit | Attending: Obstetrics and Gynecology | Admitting: Obstetrics and Gynecology

## 2019-03-27 VITALS — BP 94/61 | HR 78 | Temp 98.1°F

## 2019-03-27 DIAGNOSIS — O365990 Maternal care for other known or suspected poor fetal growth, unspecified trimester, not applicable or unspecified: Secondary | ICD-10-CM

## 2019-03-27 DIAGNOSIS — O365930 Maternal care for other known or suspected poor fetal growth, third trimester, not applicable or unspecified: Secondary | ICD-10-CM

## 2019-03-27 DIAGNOSIS — Z3A34 34 weeks gestation of pregnancy: Secondary | ICD-10-CM

## 2019-03-27 DIAGNOSIS — O359XX0 Maternal care for (suspected) fetal abnormality and damage, unspecified, not applicable or unspecified: Secondary | ICD-10-CM

## 2019-03-27 DIAGNOSIS — O43193 Other malformation of placenta, third trimester: Secondary | ICD-10-CM

## 2019-03-27 NOTE — Procedures (Signed)
ergBeverly Y Thornton 20-Jul-1991 [redacted]w[redacted]d  Fetus A Non-Stress Test Interpretation for 03/27/19  Indication: Unsatisfactory BPP  Fetal Heart Rate A Mode: External Baseline Rate (A): 140 bpm Variability: Moderate Accelerations: 15 x 15 Decelerations: None Multiple birth?: No  Uterine Activity Mode: Palpation, Toco Contraction Frequency (min): none Resting Tone Palpated: Relaxed Resting Time: Adequate  Interpretation (Fetal Testing) Nonstress Test Interpretation: Reactive Overall Impression: Reassuring for gestational age Comments: Reviewed tracing with Dr. Donalee Citrin

## 2019-04-03 ENCOUNTER — Encounter (HOSPITAL_COMMUNITY): Payer: Self-pay | Admitting: *Deleted

## 2019-04-03 ENCOUNTER — Ambulatory Visit (HOSPITAL_COMMUNITY): Payer: BC Managed Care – PPO | Admitting: *Deleted

## 2019-04-03 ENCOUNTER — Other Ambulatory Visit (HOSPITAL_COMMUNITY): Payer: Self-pay | Admitting: *Deleted

## 2019-04-03 ENCOUNTER — Ambulatory Visit (HOSPITAL_COMMUNITY)
Admission: RE | Admit: 2019-04-03 | Discharge: 2019-04-03 | Disposition: A | Discharge status: Home / Self Care | Payer: BC Managed Care – PPO | Source: Ambulatory Visit | Attending: Obstetrics and Gynecology | Admitting: Obstetrics and Gynecology

## 2019-04-03 ENCOUNTER — Other Ambulatory Visit: Payer: Self-pay

## 2019-04-03 ENCOUNTER — Other Ambulatory Visit (HOSPITAL_COMMUNITY): Payer: Self-pay | Admitting: Obstetrics and Gynecology

## 2019-04-03 VITALS — Temp 98.2°F

## 2019-04-03 DIAGNOSIS — Z3A35 35 weeks gestation of pregnancy: Secondary | ICD-10-CM

## 2019-04-03 DIAGNOSIS — O359XX0 Maternal care for (suspected) fetal abnormality and damage, unspecified, not applicable or unspecified: Secondary | ICD-10-CM | POA: Diagnosis not present

## 2019-04-03 DIAGNOSIS — O365990 Maternal care for other known or suspected poor fetal growth, unspecified trimester, not applicable or unspecified: Secondary | ICD-10-CM

## 2019-04-03 DIAGNOSIS — O365930 Maternal care for other known or suspected poor fetal growth, third trimester, not applicable or unspecified: Secondary | ICD-10-CM

## 2019-04-03 DIAGNOSIS — O43193 Other malformation of placenta, third trimester: Secondary | ICD-10-CM

## 2019-04-03 NOTE — Procedures (Signed)
Lauren Thornton Jul 10, 1991 [redacted]w[redacted]d  Fetus A Non-Stress Test Interpretation for 04/03/19  Indication: Unsatisfactory BPP  Fetal Heart Rate A Mode: External Baseline Rate (A): 145 bpm Variability: Moderate Accelerations: 15 x 15, 10 x 10 Decelerations: None Multiple birth?: No  Uterine Activity Mode: Palpation, Toco Contraction Frequency (min): x2 Contraction Duration (sec): 50-60 Contraction Quality: Mild Resting Tone Palpated: Relaxed Resting Time: Adequate  Interpretation (Fetal Testing) Nonstress Test Interpretation: Reactive Comments: Reviewed tracing with Dr. Donalee Citrin

## 2019-04-06 ENCOUNTER — Inpatient Hospital Stay (HOSPITAL_COMMUNITY)
Admission: AD | Admit: 2019-04-06 | Discharge: 2019-04-06 | Disposition: A | Discharge status: Home / Self Care | Payer: BC Managed Care – PPO | Attending: Obstetrics and Gynecology | Admitting: Obstetrics and Gynecology

## 2019-04-06 ENCOUNTER — Other Ambulatory Visit: Payer: Self-pay

## 2019-04-06 ENCOUNTER — Encounter (HOSPITAL_COMMUNITY): Payer: Self-pay | Admitting: *Deleted

## 2019-04-06 DIAGNOSIS — O368130 Decreased fetal movements, third trimester, not applicable or unspecified: Secondary | ICD-10-CM | POA: Insufficient documentation

## 2019-04-06 DIAGNOSIS — O26893 Other specified pregnancy related conditions, third trimester: Secondary | ICD-10-CM

## 2019-04-06 DIAGNOSIS — O365930 Maternal care for other known or suspected poor fetal growth, third trimester, not applicable or unspecified: Secondary | ICD-10-CM | POA: Diagnosis not present

## 2019-04-06 DIAGNOSIS — Z3A36 36 weeks gestation of pregnancy: Secondary | ICD-10-CM | POA: Diagnosis not present

## 2019-04-06 DIAGNOSIS — N898 Other specified noninflammatory disorders of vagina: Secondary | ICD-10-CM

## 2019-04-06 LAB — POCT FERN TEST: POCT Fern Test: NEGATIVE

## 2019-04-06 NOTE — Progress Notes (Signed)
Patient verbalized to RN that she has felt fetal movement since being placed on the EFM.

## 2019-04-06 NOTE — MAU Note (Signed)
Lauren Thornton is a 28 y.o. at [redacted]w[redacted]d here in MAU reporting: decreased fetal movement since last night. Felt some this AM but none since then. Has had some clear LOF, states it is watery. Having contractions since last night.  Onset of complaint: last night  Pain score: 3/10  Vitals:   04/06/19 1304  BP: (!) 107/57  Pulse: 94  Resp: 16  Temp: 98.3 F (36.8 C)  SpO2: 100%     FHT: 140  Lab orders placed from triage: none

## 2019-04-06 NOTE — MAU Provider Note (Signed)
History     Chief Complaint  Patient presents with  . Contractions  . Rupture of Membranes  . Decreased Fetal Movement    28 yo  G2P1 MF presents at [redacted]  weeks gestation with c/o decreased FM and leaking fluid. (+) irreg ctx. PNC complicated by IUGR  OB History    Gravida  2   Para  1   Term  1   Preterm      AB      Living  1     SAB      TAB      Ectopic      Multiple  0   Live Births  1           Past Medical History:  Diagnosis Date  . Anemia   . Asthma   . Breast mass left  . Pneumonia    as a child  . SVD (spontaneous vaginal delivery) 12/16/2017    Past Surgical History:  Procedure Laterality Date  . BREAST LUMPECTOMY Left 01/28/2018   Procedure: LEFT BREAST LUMPECTOMY;  Surgeon: Coralie Keens, MD;  Location: [facility];  Service: General;  Laterality: Left;  . FOOT SURGERY Right     Family History  Problem Relation Age of Onset  . Asthma Mother   . Hypertension Mother   . Hypertension Father     Social History   Tobacco Use  . Smoking status: Never Smoker  . Smokeless tobacco: Never Used  Substance Use Topics  . Alcohol use: No  . Drug use: No    Allergies: No Known Allergies  Medications Prior to Admission  Medication Sig Dispense Refill Last Dose  . albuterol (PROVENTIL HFA;VENTOLIN HFA) 108 (90 BASE) MCG/ACT inhaler Inhale 2 puffs into the lungs every 6 (six) hours as needed. For shortness of breath     . albuterol (PROVENTIL) (2.5 MG/3ML) 0.083% nebulizer solution Take 2.5 mg by nebulization every 6 (six) hours as needed for wheezing or shortness of breath.      . magnesium hydroxide (MILK OF MAGNESIA) 400 MG/5ML suspension Take 30 mLs by mouth daily. (Patient not taking: Reported on 03/13/2019) 360 mL 0   . MAGNESIUM PO Take by mouth.     . Prenatal Vit-Fe Fumarate-FA (PRENATAL MULTIVITAMIN) TABS tablet Take 1 tablet by mouth daily at 12 noon.        Physical Exam   Blood pressure 101/62, pulse 93,  temperature 98.3 F (36.8 C), temperature source Oral, resp. rate 16, height 5\' 1"  (1.549 m), weight 78.5 kg, SpO2 100 %, currently breastfeeding.  General appearance: alert, cooperative and no distress Abdomen: gravid Pelvic: ext os 1cm/internal os FT/60/-3 Extremities: no edema, redness or tenderness in the calves or thighs   Tracing: baseline   150 (+) accels 15 x 15. irreg ctx ED Course  IMP: DFM IUGR Leaking fluid P) NST. Fern   Addendum. Fern neg. NST reactive D/c home. Daily kick ct. Keep schedule appts. Labor prec MDM   Marvene Staff, MD 3:07 PM 04/06/2019

## 2019-04-08 ENCOUNTER — Other Ambulatory Visit: Payer: Self-pay | Admitting: Obstetrics and Gynecology

## 2019-04-10 ENCOUNTER — Encounter (HOSPITAL_COMMUNITY): Payer: Self-pay

## 2019-04-10 ENCOUNTER — Ambulatory Visit (HOSPITAL_COMMUNITY): Payer: BC Managed Care – PPO | Admitting: *Deleted

## 2019-04-10 ENCOUNTER — Telehealth (HOSPITAL_COMMUNITY): Payer: Self-pay | Admitting: *Deleted

## 2019-04-10 ENCOUNTER — Other Ambulatory Visit: Payer: Self-pay

## 2019-04-10 ENCOUNTER — Ambulatory Visit (HOSPITAL_COMMUNITY)
Admission: RE | Admit: 2019-04-10 | Discharge: 2019-04-10 | Disposition: A | Discharge status: Home / Self Care | Payer: BC Managed Care – PPO | Source: Ambulatory Visit | Attending: Obstetrics and Gynecology | Admitting: Obstetrics and Gynecology

## 2019-04-10 VITALS — BP 107/59 | HR 90 | Temp 98.5°F

## 2019-04-10 DIAGNOSIS — O365930 Maternal care for other known or suspected poor fetal growth, third trimester, not applicable or unspecified: Secondary | ICD-10-CM | POA: Insufficient documentation

## 2019-04-10 DIAGNOSIS — O365990 Maternal care for other known or suspected poor fetal growth, unspecified trimester, not applicable or unspecified: Secondary | ICD-10-CM | POA: Insufficient documentation

## 2019-04-10 DIAGNOSIS — O43193 Other malformation of placenta, third trimester: Secondary | ICD-10-CM | POA: Diagnosis not present

## 2019-04-10 DIAGNOSIS — Z3A36 36 weeks gestation of pregnancy: Secondary | ICD-10-CM | POA: Diagnosis not present

## 2019-04-10 DIAGNOSIS — O359XX0 Maternal care for (suspected) fetal abnormality and damage, unspecified, not applicable or unspecified: Secondary | ICD-10-CM | POA: Diagnosis not present

## 2019-04-10 NOTE — Telephone Encounter (Signed)
Preadmission screen  

## 2019-04-10 NOTE — Procedures (Signed)
CAPRIA CARTAYA 1991-08-12 [redacted]w[redacted]d  Fetus A Non-Stress Test Interpretation for 04/10/19  Indication: IUGR  Fetal Heart Rate A Mode: External Baseline Rate (A): 140 bpm Variability: Moderate Accelerations: 15 x 15 Decelerations: None Multiple birth?: No  Uterine Activity Mode: Toco Contraction Frequency (min): OCC UC noted  Interpretation (Fetal Testing) Nonstress Test Interpretation: Reactive Comments: Reviewed by Dr. Donalee Citrin

## 2019-04-11 ENCOUNTER — Encounter (HOSPITAL_COMMUNITY): Payer: Self-pay | Admitting: *Deleted

## 2019-04-12 ENCOUNTER — Other Ambulatory Visit (HOSPITAL_COMMUNITY)
Admission: RE | Admit: 2019-04-12 | Discharge: 2019-04-12 | Disposition: A | Discharge status: Home / Self Care | Payer: BC Managed Care – PPO | Source: Ambulatory Visit | Attending: Obstetrics and Gynecology | Admitting: Obstetrics and Gynecology

## 2019-04-12 ENCOUNTER — Other Ambulatory Visit: Payer: Self-pay

## 2019-04-12 DIAGNOSIS — Z01812 Encounter for preprocedural laboratory examination: Secondary | ICD-10-CM | POA: Diagnosis not present

## 2019-04-12 DIAGNOSIS — Z20828 Contact with and (suspected) exposure to other viral communicable diseases: Secondary | ICD-10-CM | POA: Insufficient documentation

## 2019-04-12 LAB — SARS CORONAVIRUS 2 (TAT 6-24 HRS): SARS Coronavirus 2: NEGATIVE

## 2019-04-12 NOTE — MAU Note (Signed)
Pt here for PAT covid swab, denies any symptoms. Swab collected

## 2019-04-14 ENCOUNTER — Inpatient Hospital Stay (HOSPITAL_COMMUNITY)
Admission: AD | Admit: 2019-04-14 | Discharge: 2019-04-17 | DRG: 786 | Disposition: A | Discharge status: Home / Self Care | Payer: BC Managed Care – PPO | Attending: Obstetrics and Gynecology | Admitting: Obstetrics and Gynecology

## 2019-04-14 ENCOUNTER — Encounter (HOSPITAL_COMMUNITY)
Admission: AD | Disposition: A | Discharge status: Home / Self Care | Payer: Self-pay | Source: Home / Self Care | Attending: Obstetrics and Gynecology

## 2019-04-14 ENCOUNTER — Encounter (HOSPITAL_COMMUNITY): Payer: Self-pay

## 2019-04-14 ENCOUNTER — Inpatient Hospital Stay (HOSPITAL_COMMUNITY): Payer: BC Managed Care – PPO

## 2019-04-14 ENCOUNTER — Other Ambulatory Visit: Payer: Self-pay

## 2019-04-14 ENCOUNTER — Inpatient Hospital Stay (HOSPITAL_COMMUNITY): Payer: BC Managed Care – PPO | Admitting: Anesthesiology

## 2019-04-14 ENCOUNTER — Encounter (HOSPITAL_COMMUNITY): Payer: Self-pay | Admitting: *Deleted

## 2019-04-14 ENCOUNTER — Inpatient Hospital Stay (HOSPITAL_COMMUNITY): Admit: 2019-04-14 | Payer: BC Managed Care – PPO | Admitting: Obstetrics and Gynecology

## 2019-04-14 DIAGNOSIS — D62 Acute posthemorrhagic anemia: Secondary | ICD-10-CM | POA: Diagnosis not present

## 2019-04-14 DIAGNOSIS — O365930 Maternal care for other known or suspected poor fetal growth, third trimester, not applicable or unspecified: Secondary | ICD-10-CM | POA: Diagnosis present

## 2019-04-14 DIAGNOSIS — Z3A37 37 weeks gestation of pregnancy: Secondary | ICD-10-CM | POA: Diagnosis not present

## 2019-04-14 DIAGNOSIS — O9081 Anemia of the puerperium: Secondary | ICD-10-CM | POA: Diagnosis not present

## 2019-04-14 DIAGNOSIS — Z98891 History of uterine scar from previous surgery: Secondary | ICD-10-CM

## 2019-04-14 DIAGNOSIS — O99824 Streptococcus B carrier state complicating childbirth: Secondary | ICD-10-CM | POA: Diagnosis present

## 2019-04-14 DIAGNOSIS — O4593 Premature separation of placenta, unspecified, third trimester: Secondary | ICD-10-CM | POA: Diagnosis present

## 2019-04-14 DIAGNOSIS — Z3490 Encounter for supervision of normal pregnancy, unspecified, unspecified trimester: Secondary | ICD-10-CM | POA: Diagnosis present

## 2019-04-14 LAB — CBC
HCT: 29.8 % — ABNORMAL LOW (ref 36.0–46.0)
HCT: 32.9 % — ABNORMAL LOW (ref 36.0–46.0)
Hemoglobin: 9.9 g/dL — ABNORMAL LOW (ref 12.0–15.0)
Hemoglobin: 10.5 g/dL — ABNORMAL LOW (ref 12.0–15.0)
MCH: 28 pg (ref 26.0–34.0)
MCH: 28.1 pg (ref 26.0–34.0)
MCHC: 33.2 g/dL (ref 30.0–36.0)
MCHC: 31.9 g/dL (ref 30.0–36.0)
MCV: 84.2 fL (ref 80.0–100.0)
MCV: 88 fL (ref 80.0–100.0)
Platelets: 195 10*3/uL (ref 150–400)
Platelets: 151 10*3/uL (ref 150–400)
RBC: 3.54 MIL/uL — ABNORMAL LOW (ref 3.87–5.11)
RBC: 3.74 MIL/uL — ABNORMAL LOW (ref 3.87–5.11)
RDW: 13.7 % (ref 11.5–15.5)
RDW: 14 % (ref 11.5–15.5)
WBC: 8.2 10*3/uL (ref 4.0–10.5)
WBC: 9.4 10*3/uL (ref 4.0–10.5)
nRBC: 0 % (ref 0.0–0.2)
nRBC: 0 % (ref 0.0–0.2)

## 2019-04-14 LAB — SAVE SMEAR(SSMR), FOR PROVIDER SLIDE REVIEW

## 2019-04-14 LAB — PROTIME-INR
INR: 1 (ref 0.8–1.2)
Prothrombin Time: 13 seconds (ref 11.4–15.2)

## 2019-04-14 LAB — FIBRINOGEN: Fibrinogen: 406 mg/dL (ref 210–475)

## 2019-04-14 LAB — ABO/RH: ABO/RH(D): O POS

## 2019-04-14 LAB — APTT: aPTT: 26 seconds (ref 24–36)

## 2019-04-14 LAB — PREPARE RBC (CROSSMATCH)

## 2019-04-14 SURGERY — Surgical Case
Anesthesia: Spinal

## 2019-04-14 MED ORDER — OXYTOCIN BOLUS FROM INFUSION: 500.0000 mL | Freq: Once | INTRAVENOUS | Status: DC

## 2019-04-14 MED ORDER — KETOROLAC TROMETHAMINE 30 MG/ML IJ SOLN: 30.0000 mg | Freq: Four times a day (QID) | INTRAMUSCULAR | Status: AC | PRN

## 2019-04-14 MED ORDER — WITCH HAZEL-GLYCERIN EX PADS: 1.0000 "application " | MEDICATED_PAD | CUTANEOUS | Status: DC | PRN

## 2019-04-14 MED ORDER — SIMETHICONE 80 MG PO CHEW: 80.0000 mg | CHEWABLE_TABLET | ORAL | Status: DC | PRN

## 2019-04-14 MED ORDER — FENTANYL CITRATE (PF) 100 MCG/2ML IJ SOLN: 25.0000 ug | INTRAMUSCULAR | Status: DC | PRN

## 2019-04-14 MED ORDER — KETOROLAC TROMETHAMINE 30 MG/ML IJ SOLN
30.0000 mg | Freq: Once | INTRAMUSCULAR | Status: AC
  Administered 2019-04-14: 30 mg via INTRAVENOUS

## 2019-04-14 MED ORDER — SENNOSIDES-DOCUSATE SODIUM 8.6-50 MG PO TABS
2.0000 | ORAL_TABLET | ORAL | Status: DC
  Administered 2019-04-15 – 2019-04-16 (×3): 2 via ORAL
  Filled 2019-04-14 (×3): qty 2

## 2019-04-14 MED ORDER — SOD CITRATE-CITRIC ACID 500-334 MG/5ML PO SOLN
30.0000 mL | ORAL | Status: DC | PRN
  Administered 2019-04-14: 30 mL via ORAL
  Filled 2019-04-14: qty 30

## 2019-04-14 MED ORDER — DIPHENHYDRAMINE HCL 25 MG PO CAPS
25.0000 mg | ORAL_CAPSULE | Freq: Four times a day (QID) | ORAL | Status: DC | PRN
  Administered 2019-04-15 – 2019-04-16 (×4): 25 mg via ORAL
  Filled 2019-04-14 (×3): qty 1

## 2019-04-14 MED ORDER — LACTATED RINGERS IV SOLN
INTRAVENOUS | Status: DC
  Administered 2019-04-14 (×2): via INTRAVENOUS

## 2019-04-14 MED ORDER — ZOLPIDEM TARTRATE 5 MG PO TABS: 5.0000 mg | ORAL_TABLET | Freq: Every evening | ORAL | Status: DC | PRN

## 2019-04-14 MED ORDER — NALBUPHINE HCL 10 MG/ML IJ SOLN: 5.0000 mg | Freq: Once | INTRAMUSCULAR | Status: DC | PRN

## 2019-04-14 MED ORDER — KETOROLAC TROMETHAMINE 30 MG/ML IJ SOLN
INTRAMUSCULAR | Status: AC
  Filled 2019-04-14: qty 1

## 2019-04-14 MED ORDER — ACETAMINOPHEN 500 MG PO TABS: 1000.0000 mg | ORAL_TABLET | Freq: Once | ORAL | Status: DC

## 2019-04-14 MED ORDER — DIPHENHYDRAMINE HCL 25 MG PO CAPS
25.0000 mg | ORAL_CAPSULE | ORAL | Status: DC | PRN
  Filled 2019-04-14: qty 1

## 2019-04-14 MED ORDER — OXYTOCIN 40 UNITS IN NORMAL SALINE INFUSION - SIMPLE MED
1.0000 m[IU]/min | INTRAVENOUS | Status: DC
  Administered 2019-04-14: 2 m[IU]/min via INTRAVENOUS
  Filled 2019-04-14: qty 1000

## 2019-04-14 MED ORDER — MORPHINE SULFATE (PF) 0.5 MG/ML IJ SOLN
INTRAMUSCULAR | Status: DC | PRN
  Administered 2019-04-14: .15 mg via INTRATHECAL

## 2019-04-14 MED ORDER — ONDANSETRON HCL 4 MG/2ML IJ SOLN
INTRAMUSCULAR | Status: DC | PRN
  Administered 2019-04-14: 4 mg via INTRAVENOUS

## 2019-04-14 MED ORDER — PHENYLEPHRINE HCL-NACL 20-0.9 MG/250ML-% IV SOLN
INTRAVENOUS | Status: AC
  Filled 2019-04-14: qty 250

## 2019-04-14 MED ORDER — NALBUPHINE HCL 10 MG/ML IJ SOLN: 5.0000 mg | INTRAMUSCULAR | Status: DC | PRN

## 2019-04-14 MED ORDER — PRENATAL MULTIVITAMIN CH
1.0000 | ORAL_TABLET | Freq: Every day | ORAL | Status: DC
  Administered 2019-04-17: 12:00:00 1 via ORAL
  Filled 2019-04-14: qty 1

## 2019-04-14 MED ORDER — OXYTOCIN 40 UNITS IN NORMAL SALINE INFUSION - SIMPLE MED: 2.5000 [IU]/h | INTRAVENOUS | Status: AC

## 2019-04-14 MED ORDER — ACETAMINOPHEN 160 MG/5ML PO SOLN: 1000.0000 mg | Freq: Once | ORAL | Status: DC

## 2019-04-14 MED ORDER — NALOXONE HCL 4 MG/10ML IJ SOLN
1.0000 ug/kg/h | INTRAVENOUS | Status: DC | PRN
  Filled 2019-04-14: qty 5

## 2019-04-14 MED ORDER — ACETAMINOPHEN 500 MG PO TABS
1000.0000 mg | ORAL_TABLET | Freq: Four times a day (QID) | ORAL | Status: DC
  Administered 2019-04-15 – 2019-04-17 (×8): 1000 mg via ORAL
  Filled 2019-04-14 (×7): qty 2

## 2019-04-14 MED ORDER — ACETAMINOPHEN 325 MG PO TABS: 650.0000 mg | ORAL_TABLET | ORAL | Status: DC | PRN

## 2019-04-14 MED ORDER — LACTATED RINGERS IV SOLN: 500.0000 mL | INTRAVENOUS | Status: DC | PRN

## 2019-04-14 MED ORDER — FENTANYL CITRATE (PF) 100 MCG/2ML IJ SOLN
INTRAMUSCULAR | Status: AC
  Filled 2019-04-14: qty 2

## 2019-04-14 MED ORDER — DEXAMETHASONE SODIUM PHOSPHATE 4 MG/ML IJ SOLN
INTRAMUSCULAR | Status: DC | PRN
  Administered 2019-04-14: 4 mg via INTRAVENOUS

## 2019-04-14 MED ORDER — ONDANSETRON HCL 4 MG/2ML IJ SOLN: 4.0000 mg | Freq: Three times a day (TID) | INTRAMUSCULAR | Status: DC | PRN

## 2019-04-14 MED ORDER — MORPHINE SULFATE (PF) 0.5 MG/ML IJ SOLN
INTRAMUSCULAR | Status: AC
  Filled 2019-04-14: qty 10

## 2019-04-14 MED ORDER — FENTANYL CITRATE (PF) 100 MCG/2ML IJ SOLN
100.0000 ug | INTRAMUSCULAR | Status: DC | PRN
  Administered 2019-04-14: 100 ug via INTRAVENOUS
  Filled 2019-04-14: qty 2

## 2019-04-14 MED ORDER — OXYTOCIN 40 UNITS IN NORMAL SALINE INFUSION - SIMPLE MED: 2.5000 [IU]/h | INTRAVENOUS | Status: DC

## 2019-04-14 MED ORDER — CEFAZOLIN SODIUM-DEXTROSE 2-4 GM/100ML-% IV SOLN
INTRAVENOUS | Status: AC
  Filled 2019-04-14: qty 100

## 2019-04-14 MED ORDER — SODIUM CHLORIDE 0.9% IV SOLUTION: Freq: Once | INTRAVENOUS | Status: DC

## 2019-04-14 MED ORDER — CEFAZOLIN SODIUM-DEXTROSE 2-3 GM-%(50ML) IV SOLR
INTRAVENOUS | Status: DC | PRN
  Administered 2019-04-14: 2 g via INTRAVENOUS

## 2019-04-14 MED ORDER — ACETAMINOPHEN 500 MG PO TABS
1000.0000 mg | ORAL_TABLET | Freq: Four times a day (QID) | ORAL | Status: AC
  Administered 2019-04-14 – 2019-04-15 (×3): 1000 mg via ORAL
  Filled 2019-04-14 (×4): qty 2

## 2019-04-14 MED ORDER — PROMETHAZINE HCL 25 MG/ML IJ SOLN: 6.2500 mg | INTRAMUSCULAR | Status: DC | PRN

## 2019-04-14 MED ORDER — BUPIVACAINE HCL (PF) 0.25 % IJ SOLN
INTRAMUSCULAR | Status: DC | PRN
  Administered 2019-04-14: 10 mL

## 2019-04-14 MED ORDER — PENICILLIN G 3 MILLION UNITS IVPB - SIMPLE MED
3.0000 10*6.[IU] | INTRAVENOUS | Status: DC
  Administered 2019-04-14: 3 10*6.[IU] via INTRAVENOUS
  Filled 2019-04-14: qty 100

## 2019-04-14 MED ORDER — OXYCODONE HCL 5 MG PO TABS
5.0000 mg | ORAL_TABLET | ORAL | Status: DC | PRN
  Administered 2019-04-15: 5 mg via ORAL
  Administered 2019-04-15 – 2019-04-16 (×2): 10 mg via ORAL
  Administered 2019-04-16 – 2019-04-17 (×4): 5 mg via ORAL
  Administered 2019-04-17: 10 mg via ORAL
  Filled 2019-04-14: qty 1
  Filled 2019-04-14: qty 2
  Filled 2019-04-14: qty 1
  Filled 2019-04-14 (×2): qty 2
  Filled 2019-04-14 (×3): qty 1

## 2019-04-14 MED ORDER — SIMETHICONE 80 MG PO CHEW
80.0000 mg | CHEWABLE_TABLET | ORAL | Status: DC
  Administered 2019-04-15 – 2019-04-16 (×3): 80 mg via ORAL
  Filled 2019-04-14 (×4): qty 1

## 2019-04-14 MED ORDER — ONDANSETRON HCL 4 MG/2ML IJ SOLN: 4.0000 mg | Freq: Four times a day (QID) | INTRAMUSCULAR | Status: DC | PRN

## 2019-04-14 MED ORDER — MENTHOL 3 MG MT LOZG: 1.0000 | LOZENGE | OROMUCOSAL | Status: DC | PRN

## 2019-04-14 MED ORDER — LIDOCAINE HCL (PF) 1 % IJ SOLN: 30.0000 mL | INTRAMUSCULAR | Status: DC | PRN

## 2019-04-14 MED ORDER — SODIUM CHLORIDE 0.9 % IV SOLN
5.0000 10*6.[IU] | Freq: Once | INTRAVENOUS | Status: AC
  Administered 2019-04-14: 5 10*6.[IU] via INTRAVENOUS
  Filled 2019-04-14: qty 5

## 2019-04-14 MED ORDER — SODIUM CHLORIDE 0.9 % IV SOLN
INTRAVENOUS | Status: DC | PRN
  Administered 2019-04-14: 15:00:00 via INTRAVENOUS

## 2019-04-14 MED ORDER — DIBUCAINE (PERIANAL) 1 % EX OINT: 1.0000 "application " | TOPICAL_OINTMENT | CUTANEOUS | Status: DC | PRN

## 2019-04-14 MED ORDER — SIMETHICONE 80 MG PO CHEW
80.0000 mg | CHEWABLE_TABLET | Freq: Three times a day (TID) | ORAL | Status: DC
  Administered 2019-04-14 – 2019-04-17 (×9): 80 mg via ORAL
  Filled 2019-04-14 (×8): qty 1

## 2019-04-14 MED ORDER — BUPIVACAINE HCL (PF) 0.25 % IJ SOLN
INTRAMUSCULAR | Status: AC
  Filled 2019-04-14: qty 30

## 2019-04-14 MED ORDER — COCONUT OIL OIL: 1.0000 "application " | TOPICAL_OIL | Status: DC | PRN

## 2019-04-14 MED ORDER — PHENYLEPHRINE HCL-NACL 20-0.9 MG/250ML-% IV SOLN
INTRAVENOUS | Status: DC | PRN
  Administered 2019-04-14: 70 ug/min via INTRAVENOUS

## 2019-04-14 MED ORDER — SODIUM CHLORIDE 0.9% FLUSH: 3.0000 mL | INTRAVENOUS | Status: DC | PRN

## 2019-04-14 MED ORDER — DIPHENHYDRAMINE HCL 50 MG/ML IJ SOLN: 12.5000 mg | INTRAMUSCULAR | Status: DC | PRN

## 2019-04-14 MED ORDER — IBUPROFEN 800 MG PO TABS
800.0000 mg | ORAL_TABLET | Freq: Three times a day (TID) | ORAL | Status: DC
  Administered 2019-04-15 – 2019-04-17 (×8): 800 mg via ORAL
  Filled 2019-04-14 (×8): qty 1

## 2019-04-14 MED ORDER — NALOXONE HCL 0.4 MG/ML IJ SOLN: 0.4000 mg | INTRAMUSCULAR | Status: DC | PRN

## 2019-04-14 MED ORDER — FENTANYL CITRATE (PF) 100 MCG/2ML IJ SOLN
INTRAMUSCULAR | Status: DC | PRN
  Administered 2019-04-14: 15 ug via INTRATHECAL

## 2019-04-14 MED ORDER — ONDANSETRON HCL 4 MG/2ML IJ SOLN
INTRAMUSCULAR | Status: AC
  Filled 2019-04-14: qty 2

## 2019-04-14 MED ORDER — TERBUTALINE SULFATE 1 MG/ML IJ SOLN: 0.2500 mg | Freq: Once | INTRAMUSCULAR | Status: DC | PRN

## 2019-04-14 MED ORDER — LACTATED RINGERS IV SOLN
INTRAVENOUS | Status: DC
  Administered 2019-04-15: 01:00:00 via INTRAVENOUS

## 2019-04-14 MED ORDER — BUPIVACAINE IN DEXTROSE 0.75-8.25 % IT SOLN
INTRATHECAL | Status: DC | PRN
  Administered 2019-04-14: 1.5 mL via INTRATHECAL

## 2019-04-14 MED ORDER — DEXAMETHASONE SODIUM PHOSPHATE 4 MG/ML IJ SOLN
INTRAMUSCULAR | Status: AC
  Filled 2019-04-14: qty 2

## 2019-04-14 MED ORDER — SODIUM CHLORIDE 0.9 % IV SOLN
INTRAVENOUS | Status: DC | PRN
  Administered 2019-04-14: 15:00:00 40 [IU] via INTRAVENOUS

## 2019-04-14 MED ORDER — SODIUM CHLORIDE 0.9 % IR SOLN
Status: DC | PRN
  Administered 2019-04-14: 1

## 2019-04-14 SURGICAL SUPPLY — 50 items
APL SKNCLS STERI-STRIP NONHPOA (GAUZE/BANDAGES/DRESSINGS) ×1
BARRIER ADHS 3X4 INTERCEED (GAUZE/BANDAGES/DRESSINGS) ×3 IMPLANT
BENZOIN TINCTURE PRP APPL 2/3 (GAUZE/BANDAGES/DRESSINGS) ×2 IMPLANT
BRR ADH 4X3 ABS CNTRL BYND (GAUZE/BANDAGES/DRESSINGS) ×1
CHLORAPREP W/TINT 26ML (MISCELLANEOUS) ×3 IMPLANT
CLAMP CORD UMBIL (MISCELLANEOUS) IMPLANT
CLOSURE STERI-STRIP 1/2X4 (GAUZE/BANDAGES/DRESSINGS) ×1
CLOSURE WOUND 1/2 X4 (GAUZE/BANDAGES/DRESSINGS)
CLOTH BEACON ORANGE TIMEOUT ST (SAFETY) ×3 IMPLANT
CLSR STERI-STRIP ANTIMIC 1/2X4 (GAUZE/BANDAGES/DRESSINGS) ×1 IMPLANT
DRAPE C SECTION CLR SCREEN (DRAPES) ×3 IMPLANT
DRSG OPSITE POSTOP 4X10 (GAUZE/BANDAGES/DRESSINGS) ×3 IMPLANT
ELECT REM PT RETURN 9FT ADLT (ELECTROSURGICAL) ×3
ELECTRODE REM PT RTRN 9FT ADLT (ELECTROSURGICAL) ×1 IMPLANT
EXTRACTOR VACUUM M CUP 4 TUBE (SUCTIONS) IMPLANT
EXTRACTOR VACUUM M CUP 4' TUBE (SUCTIONS)
GAUZE SPONGE 4X4 12PLY STRL LF (GAUZE/BANDAGES/DRESSINGS) ×4 IMPLANT
GLOVE BIOGEL PI IND STRL 7.0 (GLOVE) ×2 IMPLANT
GLOVE BIOGEL PI INDICATOR 7.0 (GLOVE) ×4
GLOVE ECLIPSE 6.5 STRL STRAW (GLOVE) ×3 IMPLANT
GOWN STRL REUS W/TWL LRG LVL3 (GOWN DISPOSABLE) ×6 IMPLANT
KIT ABG SYR 3ML LUER SLIP (SYRINGE) IMPLANT
NDL HYPO 25X5/8 SAFETYGLIDE (NEEDLE) IMPLANT
NEEDLE HYPO 22GX1.5 SAFETY (NEEDLE) ×3 IMPLANT
NEEDLE HYPO 25X5/8 SAFETYGLIDE (NEEDLE) IMPLANT
NS IRRIG 1000ML POUR BTL (IV SOLUTION) ×3 IMPLANT
PACK C SECTION WH (CUSTOM PROCEDURE TRAY) ×3 IMPLANT
PAD ABD 7.5X8 STRL (GAUZE/BANDAGES/DRESSINGS) ×4 IMPLANT
PAD OB MATERNITY 4.3X12.25 (PERSONAL CARE ITEMS) ×3 IMPLANT
RTRCTR C-SECT PINK 25CM LRG (MISCELLANEOUS) IMPLANT
STRIP CLOSURE SKIN 1/2X4 (GAUZE/BANDAGES/DRESSINGS) IMPLANT
SUT CHROMIC GUT AB #0 18 (SUTURE) IMPLANT
SUT MNCRL 0 VIOLET CTX 36 (SUTURE) ×3 IMPLANT
SUT MON AB 2-0 SH 27 (SUTURE)
SUT MON AB 2-0 SH27 (SUTURE) IMPLANT
SUT MON AB 3-0 SH 27 (SUTURE)
SUT MON AB 3-0 SH27 (SUTURE) IMPLANT
SUT MON AB 4-0 PS1 27 (SUTURE) IMPLANT
SUT MONOCRYL 0 CTX 36 (SUTURE) ×6
SUT PLAIN 2 0 (SUTURE) ×3
SUT PLAIN 2 0 XLH (SUTURE) IMPLANT
SUT PLAIN ABS 2-0 CT1 27XMFL (SUTURE) IMPLANT
SUT VIC AB 0 CT1 36 (SUTURE) ×6 IMPLANT
SUT VIC AB 2-0 CT1 27 (SUTURE) ×3
SUT VIC AB 2-0 CT1 TAPERPNT 27 (SUTURE) ×1 IMPLANT
SUT VIC AB 4-0 PS2 27 (SUTURE) IMPLANT
SYR CONTROL 10ML LL (SYRINGE) ×3 IMPLANT
TOWEL OR 17X24 6PK STRL BLUE (TOWEL DISPOSABLE) ×3 IMPLANT
TRAY FOLEY W/BAG SLVR 14FR LF (SET/KITS/TRAYS/PACK) IMPLANT
WATER STERILE IRR 1000ML POUR (IV SOLUTION) ×3 IMPLANT

## 2019-04-14 NOTE — Progress Notes (Addendum)
Called at 2:15pm to assess bright red vaginal bleeding.  At bedside at 2:23pm.   S:  Reports intermittent contractions in front and back.  Reports adequate rest in between contractions.  Denies severe abdominal pain. States she felt a gush of fluid right before getting up to the bathroom, but states it was blood and passed a large clot. Passed another large clot while sitting on toilet.   O:  Pitocin was at 14 milliunits, and turned off at 1410 VS: Blood pressure 112/62, pulse 84, temperature 98.9 F (37.2 C), temperature source Oral, resp. rate 18, height 5\' 1"  (1.549 m), weight 78.5 kg, currently breastfeeding.        FHR : baseline 140 bpm / variability moderate / accelerations no / no decelerations  Abdomen: soft, non-tender  Uterus: gravid, non-tender        Toco: contractions every 2-5 minutes / mild         Cervix : 3.5cm/ OOP - and unable to palpate presenting part;  bedside ultrasound confirmed vertex         Membranes: intact     Total QBL: 473mL  A/P: 37+1 weeks IOL for IUGR      Latent labor     FHR category 1    GBS positive - receiving PCN     ? Placental abruption, no evidence of PEC; r/o HELLP Updated Dr. Garwin Brothers and en route. Stat CBC and DIC panel now. Pt. Requesting Primary CS even if vaginal attempt okay.  FHR stable and reassuring at this time, will plan to discuss with Dr. Garwin Brothers.     Lars Pinks, MSN, CNM [facility]

## 2019-04-14 NOTE — Anesthesia Preprocedure Evaluation (Addendum)
Anesthesia Evaluation  Patient identified by MRN, date of birth, ID band Patient awake    Reviewed: Allergy & Precautions, NPO status , Patient's Chart, lab work & pertinent test results  History of Anesthesia Complications Negative for: history of anesthetic complications  Airway Mallampati: II  TM Distance: >3 FB Neck ROM: Full    Dental no notable dental hx.    Pulmonary asthma ,    Pulmonary exam normal        Cardiovascular negative cardio ROS Normal cardiovascular exam     Neuro/Psych negative neurological ROS     GI/Hepatic negative GI ROS, Neg liver ROS,   Endo/Other  negative endocrine ROS  Renal/GU negative Renal ROS     Musculoskeletal negative musculoskeletal ROS (+)   Abdominal   Peds  Hematology  (+) anemia , Hgb 10.5   Anesthesia Other Findings Day of surgery medications reviewed with the patient.  Reproductive/Obstetrics (+) Pregnancy Placental abruption                             Anesthesia Physical Anesthesia Plan  ASA: III and emergent  Anesthesia Plan: Spinal   Post-op Pain Management:    Induction:   PONV Risk Score and Plan: 3 and Treatment may vary due to age or medical condition, Ondansetron and Dexamethasone  Airway Management Planned: Natural Airway  Additional Equipment:   Intra-op Plan:   Post-operative Plan:   Informed Consent: I have reviewed the patients History and Physical, chart, labs and discussed the procedure including the risks, benefits and alternatives for the proposed anesthesia with the patient or authorized representative who has indicated his/her understanding and acceptance.     Dental advisory given  Plan Discussed with: CRNA  Anesthesia Plan Comments: (Urgent C/S called for suspected placental abruption (EBL ~400cc in L&D), remote from delivery. VSS, no concerns with fetal heart tones. Discussed with Dr. Garwin Brothers, plan  for regional anesthesia. Daiva Huge, MD)       Anesthesia Quick Evaluation

## 2019-04-14 NOTE — Brief Op Note (Signed)
04/14/2019  4:03 PM  PATIENT:  Lauren Thornton  28 y.o. female  PRE-OPERATIVE DIAGNOSIS:  Placental abruption, severe IUGR, IUP @ 17 1/7 week  POST-OPERATIVE DIAGNOSIS:  Placental abruption, IUGR, IUP @ 55 1/7 week  PROCEDURE:  Primary Cesarean section, kerr hysterotomy  SURGEON:  Surgeon(s) and Role:    * Servando Salina, MD - Primary  PHYSICIAN ASSISTANT:   ASSISTANTS: Lars Pinks, CNM   ANESTHESIA:   spinal Findings: live female, nl tubes and ovaries. Focal old clot right post LUS.  Clear AF EBL:  527 ml  BLOOD ADMINISTERED:none  DRAINS: none   LOCAL MEDICATIONS USED:  MARCAINE     SPECIMEN:  Source of Specimen:  placenta  DISPOSITION OF SPECIMEN:  PATHOLOGY  COUNTS:  YES  TOURNIQUET:  * No tourniquets in log *  DICTATION: .Other Dictation: Dictation Number [id number]  PLAN OF CARE: Admit to inpatient   PATIENT DISPOSITION:  PACU - hemodynamically stable.   Delay start of Pharmacological VTE agent (>24hrs) due to surgical blood loss or risk of bleeding: no

## 2019-04-14 NOTE — Progress Notes (Signed)
Called by RN for increased vaginal bleeding with passage of clots On arrival, EBL 446 VE: loose 3 cm/pp out of pelvis/palp clots Pitocin off Tracing baseline 140  (+) good variability No decel irreg ctx since pitocin off  IMP: Placental abruption  severe IUGR With delivery remote.  IUP @ 37 1/7 week GBS cx (+) P) proceed with C/S. Risk of surgery including infection, bleeding, injury to  Bladder, bowel, ureters,  Poss need for blood transfusion and its risk, internal scar tissue. Will get DIC panel OR notified

## 2019-04-14 NOTE — Transfer of Care (Signed)
Immediate Anesthesia Transfer of Care Note  Patient: Lauren Thornton  Procedure(s) Performed: CESAREAN SECTION (N/A )  Patient Location: PACU  Anesthesia Type:Spinal  Level of Consciousness: awake, alert  and patient cooperative  Airway & Oxygen Therapy: Patient Spontanous Breathing  Post-op Assessment: Report given to RN and Post -op Vital signs reviewed and stable  Post vital signs: Reviewed and stable  Last Vitals:  Vitals Value Taken Time  BP 108/73 04/14/19 1622  Temp 97.7 04/14/19 1624  Pulse 75 04/14/19 1625  Resp 17 04/14/19 1625  SpO2 100 % 04/14/19 1625  Vitals shown include unvalidated device data.  Last Pain:  Vitals:   04/14/19 1500  TempSrc:   PainSc: 0-No pain         Complications: No apparent anesthesia complications

## 2019-04-14 NOTE — Anesthesia Postprocedure Evaluation (Signed)
Anesthesia Post Note  Patient: Lauren Thornton  Procedure(s) Performed: CESAREAN SECTION (N/A )     Patient location during evaluation: PACU Anesthesia Type: Spinal Level of consciousness: awake and alert and oriented Pain management: pain level controlled Vital Signs Assessment: post-procedure vital signs reviewed and stable Respiratory status: spontaneous breathing, nonlabored ventilation and respiratory function stable Cardiovascular status: blood pressure returned to baseline Postop Assessment: no apparent nausea or vomiting and spinal receding Anesthetic complications: no    Last Vitals:  Vitals:   04/14/19 1700 04/14/19 1715  BP: 111/65 114/69  Pulse: 78 79  Resp: 16 14  Temp:  36.7 C  SpO2: 98% 99%    Last Pain:  Vitals:   04/14/19 1715  TempSrc: Oral  PainSc: 0-No pain   Pain Goal:    LLE Motor Response: Purposeful movement (04/14/19 1715) LLE Sensation: Numbness (04/14/19 1715) RLE Motor Response: Purposeful movement (04/14/19 1715) RLE Sensation: Numbness (04/14/19 1715)     Epidural/Spinal Function Cutaneous sensation: Vague (04/14/19 1715), Patient able to flex knees: No (04/14/19 1715), Patient able to lift hips off bed: No (04/14/19 1715), Back pain beyond tenderness at insertion site: No (04/14/19 1715), Progressively worsening motor and/or sensory loss: No (04/14/19 1715), Bowel and/or bladder incontinence post epidural: No (04/14/19 1715)  Brennan Bailey

## 2019-04-14 NOTE — Anesthesia Procedure Notes (Signed)
Spinal  Patient location during procedure: OR Start time: 04/14/2019 3:06 PM End time: 04/14/2019 3:08 PM Staffing Anesthesiologist: Brennan Bailey, MD Performed: anesthesiologist  Preanesthetic Checklist Completed: patient identified, pre-op evaluation, timeout performed, IV checked, risks and benefits discussed and monitors and equipment checked Spinal Block Patient position: sitting Prep: site prepped and draped and DuraPrep Patient monitoring: heart rate, continuous pulse ox and blood pressure Approach: midline Location: L3-4 Injection technique: single-shot Needle Needle type: Pencan  Needle gauge: 24 G Needle length: 10 cm Assessment Sensory level: T4 Additional Notes Risks, benefits, and alternative discussed. Patient gave consent to procedure. Prepped and draped in sitting position. Clear CSF obtained after one needle pass. Positive terminal aspiration. No pain or paraesthesias with injection. Patient tolerated procedure well. Vital signs stable. Tawny Asal, MD

## 2019-04-14 NOTE — Op Note (Signed)
NAME: Lauren Thornton, Lauren Thornton MEDICAL RECORD ZO:[id number] ACCOUNT [id number] DATE OF BIRTH:1991/07/22 FACILITY: [facility] LOCATION: [facility] PHYSICIAN:Jasmeen Fritsch A. Aidan Caloca, MD  OPERATIVE REPORT  DATE OF PROCEDURE:  04/14/2019  PREOPERATIVE DIAGNOSIS:  Placental abruption, severe IUGR, intrauterine gestation at 24 and 1/7th weeks.  PROCEDURE:  Primary cesarean section, Kerr hysterotomy.  POSTOPERATIVE DIAGNOSIS:  Placental abruption, IUGR, intrauterine gestation at 20 and 1/7th weeks.  ANESTHESIA:  Spinal.  SURGEON:  Servando Salina, MD  ASSISTANT:  Lars Pinks, CNM.  DESCRIPTION OF PROCEDURE:  Under adequate spinal anesthesia, the patient was placed in the supine position with a left lateral tilt.  She was sterilely prepped and draped in usual fashion.  Indwelling Foley catheter was sterilely placed.  Marcaine 0.25%  was injected along the planned Pfannenstiel skin incision site.  Pfannenstiel skin incision was then made, carried down to the rectus fascia.  Rectus fascia was opened transversely.  The rectus fascia was then bluntly and sharply dissected off the rectus  muscle in a superior and inferior fashion.  The rectus muscle split in the midline.  The parietal peritoneum was entered sharply and extended.  A self-retaining Alexis retractor was then placed.  Vesicouterine peritoneum was opened transversely.  The  bladder was displaced inferiorly with blunt dissection.  A curvilinear low transverse incision was then made and extended bluntly in a cephalic and caudad manner.  Artificial rupture of membranes with clear amniotic fluid.  Subsequent delivery of a live  female, vertex was accomplished.  Baby was bulb suctioned on the abdomen.  Cord was clamped and cut.  The baby was transferred to the awaiting pediatrician.  Apgars were 9 and 9 at one and 5 minutes respectively.  Placenta evidence of partial small abruption in  the anterior aspect over the posterior placenta, which was spontaneous,  intact, sent to pathology.  Uterine cavity was cleaned of debris.  Uterine incision had no extension and was closed in 2 layers, the first layer 0 Monocryl running lock stitch,  second layer was imbricating using 0 Monocryl suture.  Bleeding from both angles resulted in several figure-of-eight sutures being placed.  In addition, on the left O'Leary stitch was placed for bleeding vessel there as well.  Normal tubes and ovaries were  noted bilaterally.  The abdomen was irrigated and suctioned of debris.  Ubaldo Glassing was then removed.  Interceed was placed in an inverted T fashion overlying the uterine incision.  Parietal peritoneum was closed with 2-0 Vicryl.  The rectus fascia was closed  with 0 Vicryl x2.  The subcutaneous area was irrigated, small bleeders cauterized.  Interrupted 2-0 plain sutures placed and the skin approximated using 4-0 Vicryl subcuticular closure.  Steri-Strips and benzoin was placed.  SPECIMEN:  Placenta sent to the pathology.  ESTIMATED BLOOD LOSS:  727 mL  URINE OUTPUT:  300 mL clear yellow urine.  INTRAOPERATIVE FLUIDS:  2500 mL  COUNTS:  Sponge and instrument counts x2 was correct.  COMPLICATIONS:  None.  The patient tolerated the procedure well and was transferred to recovery room in stable condition.  The baby stayed with the mother in the surgery.  TN/NUANCE  D:04/14/2019 T:04/14/2019 JOB:[id number]

## 2019-04-14 NOTE — H&P (Signed)
Lauren Thornton is a 28 y.o. female presenting @ 37 1/7 weeks for  Severe IUGR.  OB History    Gravida  2   Para  1   Term  1   Preterm      AB      Living  1     SAB      TAB      Ectopic      Multiple  0   Live Births  1          Past Medical History:  Diagnosis Date  . Anemia   . Asthma   . Breast mass left  . Pneumonia    as a child  . SVD (spontaneous vaginal delivery) 12/16/2017   Past Surgical History:  Procedure Laterality Date  . BREAST LUMPECTOMY Left 01/28/2018   Procedure: LEFT BREAST LUMPECTOMY;  Surgeon: Coralie Keens, MD;  Location: [facility];  Service: General;  Laterality: Left;  . FOOT SURGERY Right    Family History: family history includes Asthma in her mother; Hypertension in her father and mother. Social History:  reports that she has never smoked. She has never used smokeless tobacco. She reports that she does not drink alcohol or use drugs.     Maternal Diabetes: No Genetic Screening: Normal Maternal Ultrasounds/Referrals: IUGR Fetal Ultrasounds or other Referrals:  None Maternal Substance Abuse:  No Significant Maternal Medications:  None Significant Maternal Lab Results:  Group B Strep positive Other Comments:  IUGR  Review of Systems  All other systems reviewed and are negative.  Maternal Medical History:  Prenatal complications: IUGR.   Prenatal Complications - Diabetes: none.     Blood pressure 112/62, pulse 84, temperature 98.9 F (37.2 C), temperature source Oral, resp. rate 18, height 5\' 1"  (1.549 m), weight 78.5 kg, currently breastfeeding. Exam Physical Exam  Constitutional: She is oriented to person, place, and time. She appears well-developed and well-nourished.  Eyes: EOM are normal.  Neck: Neck supple.  Cardiovascular: Regular rhythm.  Respiratory: Effort normal.  GI: Soft.  Musculoskeletal: Normal range of motion.  Neurological: She is alert and oriented to person, place, and time.   Skin: Skin is warm and dry.  Psychiatric: She has a normal mood and affect.    Prenatal labs: ABO, Rh: --/--/O POS, O POS Performed at [facility], [place] [address], [place], [place] [address]  [phone]) Antibody: NEG (08/17 8786) Rubella: Immune (02/07 0000) RPR: Nonreactive (02/07 0000)  HBsAg: Negative (02/07 0000)  HIV: Non-reactive (02/07 0000)  GBS:   positive  Assessment/Plan: IUGR GBS cx positive IUP@ 37 1/7 weeks P) admit routine labs. IV PCN. IV pitocin. analgesic  Raeanna Soberanes A Nolberto Cheuvront 04/14/2019, 2:57 PM

## 2019-04-15 ENCOUNTER — Encounter (HOSPITAL_COMMUNITY): Payer: Self-pay | Admitting: Obstetrics and Gynecology

## 2019-04-15 LAB — CBC
HCT: 24.9 % — ABNORMAL LOW (ref 36.0–46.0)
Hemoglobin: 8.1 g/dL — ABNORMAL LOW (ref 12.0–15.0)
MCH: 27.6 pg (ref 26.0–34.0)
MCHC: 32.5 g/dL (ref 30.0–36.0)
MCV: 85 fL (ref 80.0–100.0)
Platelets: 195 10*3/uL (ref 150–400)
RBC: 2.93 MIL/uL — ABNORMAL LOW (ref 3.87–5.11)
RDW: 13.6 % (ref 11.5–15.5)
WBC: 14.8 10*3/uL — ABNORMAL HIGH (ref 4.0–10.5)
nRBC: 0 % (ref 0.0–0.2)

## 2019-04-15 LAB — RPR: RPR Ser Ql: NONREACTIVE

## 2019-04-15 MED ORDER — SODIUM CHLORIDE 0.9 % IV SOLN
510.0000 mg | INTRAVENOUS | Status: DC
  Administered 2019-04-15: 510 mg via INTRAVENOUS
  Filled 2019-04-15: qty 17

## 2019-04-15 NOTE — Lactation Note (Signed)
This note was copied from a baby's chart. Lactation Consultation Note  Patient Name: Lauren Thornton [id number] Date: 04/15/2019 Reason for consult: Initial assessment  Baby is 15 hours old  As LC entered the room baby was finishing feeding on the left and LC assisted to latch on the right after large wet and stool changed.  Baby placed STS on the right breast / football/ and baby latched for 15 mins with multiple swallows , sustaining a deep latch and per mom comfortable.  LC fixed the 20 ml of the Neosure 22 cal supplement. And mom PACE fed abby with flanged lips and baby did  Well. After mom eats dinner she plans to pump with the DEBP that is already set up.  Mom pumped earlier  today with good results, fed back to baby.  LC reviewed the potential feeding behaviors of a Early term infant and the [facility] plan .  Feed the baby STS for 15 -20 mins , supplement with 20 ml to start and gradually increase the volume by 48 hours should be 30 ml.  After feeding post pump both breast for 15 -20 mins , save milk for the next feeding.  Per mom has a DEBP Ameda puimp at home from her 1st baby which she ended up not liking its performance and rented a DEBP Medela for 4 months.  LC also recommended checking her insurance company for the selections of the DEBP's .  Mom aware of the [facility] resources after D/C at [facility] health at this consult.     Maternal Data Has patient been taught Hand Expression?: Yes Does the patient have breastfeeding experience prior to this delivery?: Yes  Feeding Feeding Type: Breast Fed  LATCH Score Latch: Grasps breast easily, tongue down, lips flanged, rhythmical sucking.  Audible Swallowing: Spontaneous and intermittent  Type of Nipple: Everted at rest and after stimulation  Comfort (Breast/Nipple): Soft / non-tender  Hold (Positioning): Assistance needed to correctly position infant at breast and maintain latch.  LATCH Score: 9  Interventions Interventions: Skin to  skin;Breast massage;Breast feeding basics reviewed;Assisted with latch;Breast compression;Adjust position;Support pillows;Position options;Hand pump;DEBP  Lactation Tools Discussed/Used Tools: Pump Breast pump type: Manual;Double-Electric Breast Pump WIC Program: Yes Pump Review: Milk Storage;Setup, frequency, and cleaning Initiated by:: DEBP was set up by the [facility] RN   Consult Status Consult Status: Follow-up Date: 04/16/19 Follow-up type: In-patient    [place] 04/15/2019, 4:50 PM

## 2019-04-15 NOTE — Progress Notes (Signed)
SVD: primary  S:  Pt reports feeling  well/ Tolerating po/ has not voided No n/v/ Bleeding is moderate/ Pain controlled withnarcotic analgesics including oxycodone/acetaminophen (Percocet, Tylox)    O:  A & O x 3 / VS: Blood pressure (!) 97/58, pulse 68, temperature 98.8 F (37.1 C), temperature source Oral, resp. rate 16, height 5\' 1"  (1.549 m), weight 78.5 kg, SpO2 100 %, unknown if currently breastfeeding.  LABS:  Results for orders placed or performed during the hospital encounter of 04/14/19 (from the past 24 hour(s))  Prepare RBC     Status: None   Collection Time: 04/14/19  2:48 PM  Result Value Ref Range   Order Confirmation      ORDER PROCESSED BY BLOOD BANK Performed at [facility], [place] [address] [place], [place] [address]   Protime-INR     Status: None   Collection Time: 04/14/19  3:05 PM  Result Value Ref Range   Prothrombin Time 13.0 11.4 - 15.2 seconds   INR 1.0 0.8 - 1.2  APTT     Status: None   Collection Time: 04/14/19  3:05 PM  Result Value Ref Range   aPTT 26 24 - 36 seconds  Fibrinogen     Status: None   Collection Time: 04/14/19  3:05 PM  Result Value Ref Range   Fibrinogen 406 210 - 475 mg/dL  Save Smear     Status: None   Collection Time: 04/14/19  3:05 PM  Result Value Ref Range   Smear Review SMEAR STAINED AND AVAILABLE FOR REVIEW   CBC     Status: Abnormal   Collection Time: 04/14/19  3:05 PM  Result Value Ref Range   WBC 9.4 4.0 - 10.5 K/uL   RBC 3.74 (L) 3.87 - 5.11 MIL/uL   Hemoglobin 10.5 (L) 12.0 - 15.0 g/dL   HCT 32.9 (L) 36.0 - 46.0 %   MCV 88.0 80.0 - 100.0 fL   MCH 28.1 26.0 - 34.0 pg   MCHC 31.9 30.0 - 36.0 g/dL   RDW 14.0 11.5 - 15.5 %   Platelets 151 150 - 400 K/uL   nRBC 0.0 0.0 - 0.2 %  CBC     Status: Abnormal   Collection Time: 04/15/19  8:28 AM  Result Value Ref Range   WBC 14.8 (H) 4.0 - 10.5 K/uL   RBC 2.93 (L) 3.87 - 5.11 MIL/uL   Hemoglobin 8.1 (L) 12.0 - 15.0 g/dL   HCT 24.9 (L) 36.0 - 46.0 %   MCV 85.0  80.0 - 100.0 fL   MCH 27.6 26.0 - 34.0 pg   MCHC 32.5 30.0 - 36.0 g/dL   RDW 13.6 11.5 - 15.5 %   Platelets 195 150 - 400 K/uL   nRBC 0.0 0.0 - 0.2 %    I&O: I/O last 3 completed shifts: In: 4344.7 [P.O.:840; I.V.:3504.7] Out: 3598 [Urine:2425; Blood:1173]   No intake/output data recorded.  Lungs: chest clear, no wheezing, rales, normal symmetric air entry  Heart: regular rate and rhythm, S1, S2 normal, no murmur, click, rub or gallop  Abdomen: soft uterus 2 FB below umb, primary dressing ( pressure) stained with dried blood removed and replaced with honeycomb dressing  Perineum: is normal  Lochia: mod  Extremities:no redness or tenderness in the calves or thighs, no edema    A/P: POD # 1/PPD # 1/ [id number]  Doing well  Continue routine post partum orders  2) IDA  Complicated by acute blood loss P) IV  feraheme x 2

## 2019-04-16 LAB — CBC
HCT: 25.6 % — ABNORMAL LOW (ref 36.0–46.0)
Hemoglobin: 8.4 g/dL — ABNORMAL LOW (ref 12.0–15.0)
MCH: 28.7 pg (ref 26.0–34.0)
MCHC: 32.8 g/dL (ref 30.0–36.0)
MCV: 87.4 fL (ref 80.0–100.0)
Platelets: 221 10*3/uL (ref 150–400)
RBC: 2.93 MIL/uL — ABNORMAL LOW (ref 3.87–5.11)
RDW: 14.2 % (ref 11.5–15.5)
WBC: 12.2 10*3/uL — ABNORMAL HIGH (ref 4.0–10.5)
nRBC: 0.2 % (ref 0.0–0.2)

## 2019-04-16 MED ORDER — POLYSACCHARIDE IRON COMPLEX 150 MG PO CAPS
150.0000 mg | ORAL_CAPSULE | Freq: Every day | ORAL | Status: DC
  Administered 2019-04-17: 150 mg via ORAL
  Filled 2019-04-16: qty 1

## 2019-04-16 MED ORDER — MAGNESIUM OXIDE 400 (241.3 MG) MG PO TABS
400.0000 mg | ORAL_TABLET | Freq: Every day | ORAL | Status: DC
  Administered 2019-04-17: 400 mg via ORAL
  Filled 2019-04-16: qty 1

## 2019-04-16 NOTE — Progress Notes (Signed)
POSTOPERATIVE DAY # 2 S/P CS  Subjective:  reports feeling more pain today - burning and aching at incision tolerating po intake / no nausea / no vomiting / + flatus / no BM pain minimally controlled -reports Oxycodone helps best for pain up ad lib / ambulatory in room / voiding QS  Newborn Breast & bottle with formula / Circumcision planned prior to DC  Objective: VS: BP 105/64   Pulse 75   Temp 98.7 F (37.1 C) (Oral)   Resp 16   Ht 5\' 1"  (1.549 m)   Wt 78.5 kg   LMP  (LMP Unknown)   SpO2 99%   Breastfeeding Unknown   BMI 32.69 kg/m   Physical Exam: alert and oriented X3 without any distress or pain lung fields are clear and unlabored heart rate regular / normal rhythm / no mumurs abdomen soft, non-tender, non-distended  / bowel sounds are active uterine fundus firm, non-tender, Ueven surgical incision assessed with honeycomb dressing intact without drainage  lochia: light extremities: trace pedal edema, no calf pain or tenderness  Assessment / Plan:         POD # 2 S/P CS ABL anemia - s/p IV iron routine postoperative care  [place], MSN, [facility] 04/16/2019, 9:01 AM

## 2019-04-17 MED ORDER — OXYCODONE HCL 5 MG PO TABS: 5.0000 mg | ORAL_TABLET | ORAL | 0 refills | Status: AC | PRN

## 2019-04-17 MED ORDER — IBUPROFEN 800 MG PO TABS: 800.0000 mg | ORAL_TABLET | Freq: Three times a day (TID) | ORAL | 0 refills | Status: AC

## 2019-04-17 MED ORDER — POLYSACCHARIDE IRON COMPLEX 150 MG PO CAPS: 150.0000 mg | ORAL_CAPSULE | Freq: Every day | ORAL | 2 refills | Status: AC

## 2019-04-17 NOTE — Discharge Summary (Signed)
Obstetric Discharge Summary Reason for Admission: induction of labor Prenatal Procedures: none Intrapartum Procedures: 1ltcs Postpartum Procedures: iv iron Complications-Operative and Postpartum: none Hemoglobin  Date Value Ref Range Status  04/16/2019 8.4 (L) 12.0 - 15.0 g/dL Final   HCT  Date Value Ref Range Status  04/16/2019 25.6 (L) 36.0 - 46.0 % Final    Physical Exam:  General: alert, cooperative and no distress Lochia: appropriate Uterine Fundus: firm Incision: healing well DVT Evaluation: No evidence of DVT seen on physical exam.  Discharge Diagnoses: Term Pregnancy-delivered  Discharge Information: Date: 04/17/2019 Activity: pelvic rest and lifting restrictions Diet: routine Medications: Ibuprofen, Iron and oxycodone Condition: stable Instructions: refer to practice specific booklet Discharge to: home   Newborn Data: Live born female  Birth Weight: 5 lb 6.2 oz (2445 g) APGAR: 9, 9  Newborn Delivery   Birth date/time: 04/14/2019 15:26:00 Delivery type: C-Section, Low Transverse Trial of labor: Yes C-section categorization: Primary      Home with mother.  Charyl Bigger 04/17/2019, 7:53 AM

## 2019-04-17 NOTE — Lactation Note (Signed)
This note was copied from a baby's chart. Lactation Consultation Note:  Mother reports that breastfeeding is going well. She reports that she last pumped 30 ml. Mother reports that she may rent a pump from the [employer]. She plans to order a pump . Mother advised that she will feel breast fullness in 1-2 more days. Mother advised to continue to breastfeed and to post pump after each feeding.  Mother has a 68 month old at home. Advised mother to nap and rest frequently. Mother advised to breastfeed infant on cue and if sleepy for feedings to supplement infant . Mother may use a wide base bottle nipple for supplementing .  Mother denies having any nipple soreness.  Mother would like to follow up with [facility] for pre and post weight assessment.   Patient Name: Lauren Thornton [id number] Date: 04/17/2019 Reason for consult: Follow-up assessment   Maternal Data    Feeding Feeding Type: Breast Fed  LATCH Score                   Interventions Interventions: Hand pump;DEBP  Lactation Tools Discussed/Used     Consult Status      Darla Lesches 04/17/2019, 11:25 AM

## 2019-04-17 NOTE — Progress Notes (Signed)
No sob/cp, n/v, tol po; ambulating, passing flatus; voiding w/o difficulty Pain better controlled, no c/o; just a mild burning sensation left side of incision; denies lightheadedness/dizziness  Temp:  [98 F (36.7 C)-98.5 F (36.9 C)] 98 F (36.7 C) (08/20 0600) Pulse Rate:  [72-76] 72 (08/20 0600) Resp:  [18-20] 18 (08/20 0600) BP: (112-114)/(63-68) 112/68 (08/20 0600) SpO2:  [100 %] 100 % (08/20 0600)  A&ox3 rrr ctab Abd: soft, nt, nd; +bs; dressing: c/d/i; fundus firm and below umb, 2cm LE: no edema, nt bilat  CBC Latest Ref Rng & Units 04/16/2019 04/15/2019 04/14/2019  WBC 4.0 - 10.5 K/uL 12.2(H) 14.8(H) 9.4  Hemoglobin 12.0 - 15.0 g/dL 8.4(L) 8.1(L) 10.5(L)  Hematocrit 36.0 - 46.0 % 25.6(L) 24.9(L) 32.9(L)  Platelets 150 - 400 K/uL 221 195 151   A/P: pod 3 s/p ltcs 1. Doing well, d/c hom and f/u in office in 6 wks 2. Chronic anemia with acute change - s/p iv iron ; plan bid iron pp 3. Rh pos 4. Rubella immune

## 2019-04-18 LAB — BPAM RBC
Blood Product Expiration Date: 202009112359
Blood Product Expiration Date: 202009112359
ISSUE DATE / TIME: 202008131059
Unit Type and Rh: 5100
Unit Type and Rh: 5100

## 2019-04-18 LAB — TYPE AND SCREEN
ABO/RH(D): O POS
Antibody Screen: NEGATIVE
Unit division: 0
Unit division: 0

## 2020-11-02 IMAGING — US US OB < 14 WEEKS - US OB TV
1 series · 15 of 28 positions shown · non-contrast
Comparison: 04/22/2017

CLINICAL DATA: Early pregnancy with vaginal bleeding.

EXAM:
OBSTETRIC <14 WK US AND TRANSVAGINAL OB US
TECHNIQUE: Both transabdominal and transvaginal ultrasound examinations were
performed for complete evaluation of the gestation as well as the
maternal uterus, adnexal regions, and pelvic cul-de-sac.
Transvaginal technique was performed to assess early pregnancy.

[Series 1: us ob < 14 weeks - us ob tv · 15 of 62 slices shown]
[im 1/62]
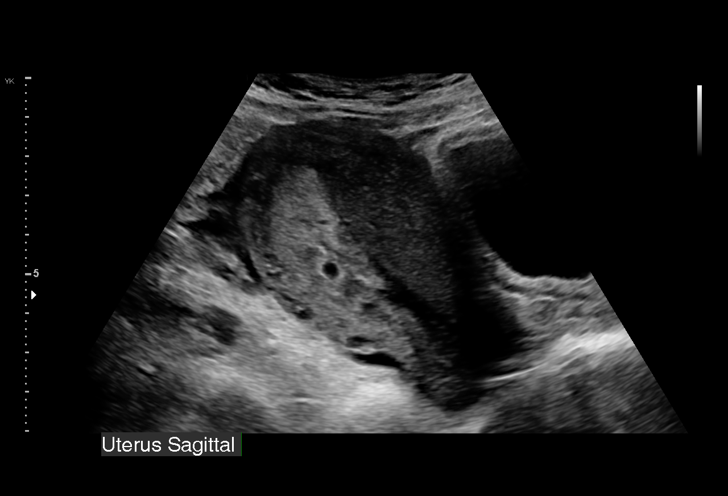
[im 5/62]
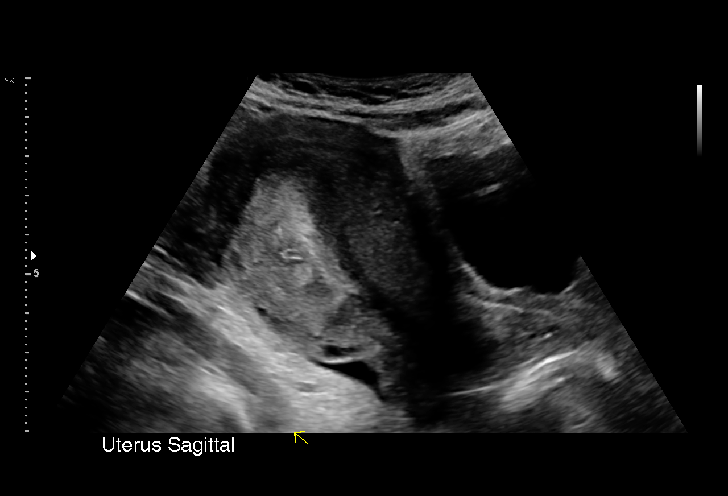
[im 10/62]
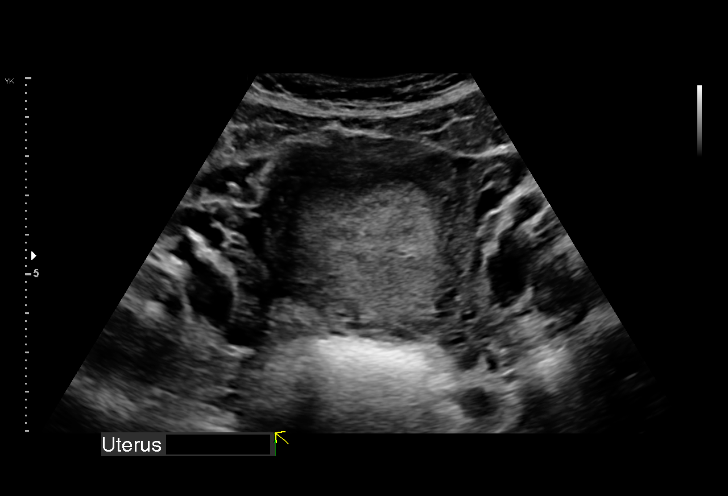
[im 14/62]
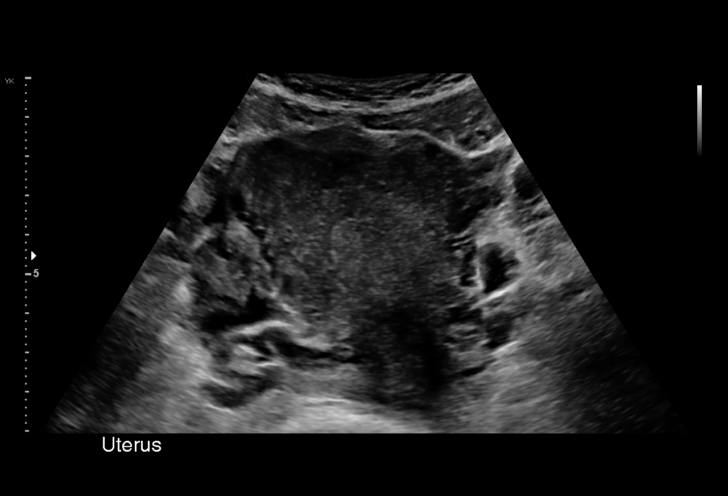
[im 19/62]
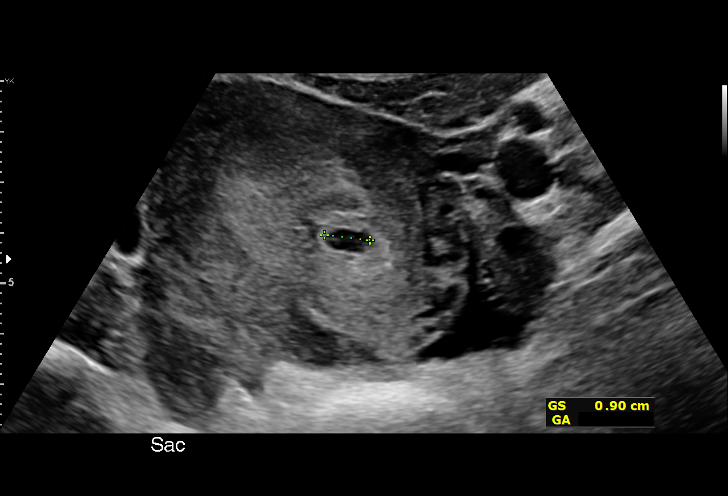
[im 23/62]
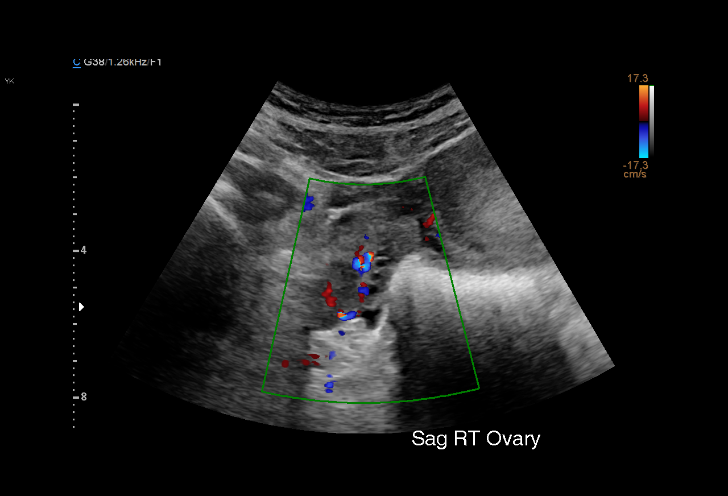
[im 28/62]
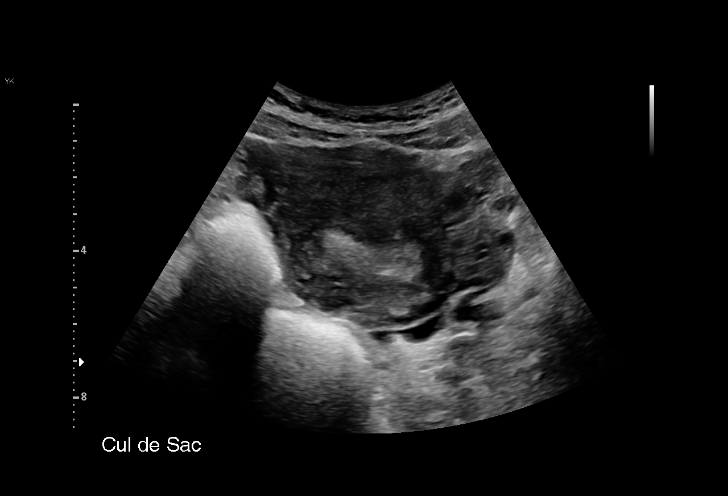
[im 32/62]
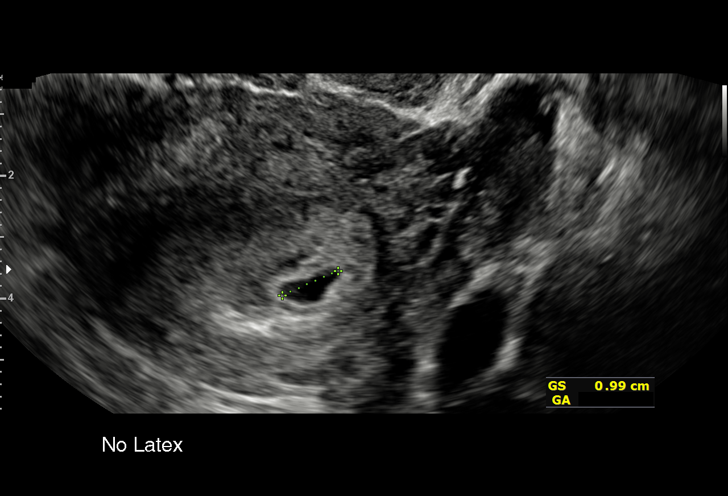
[im 34/62]
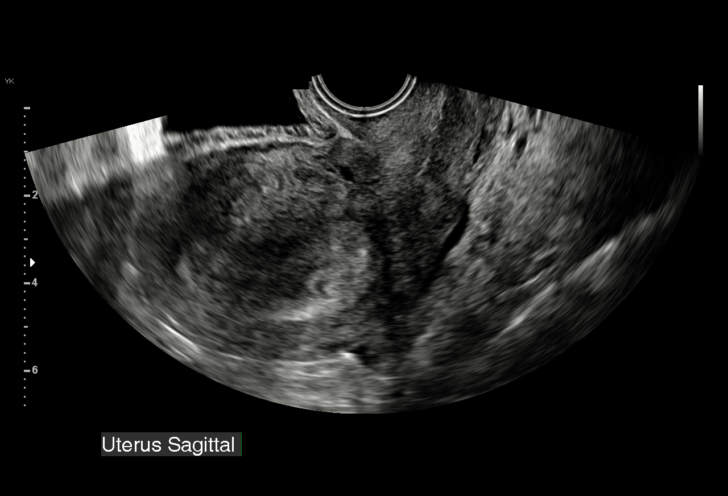
[im 39/62]
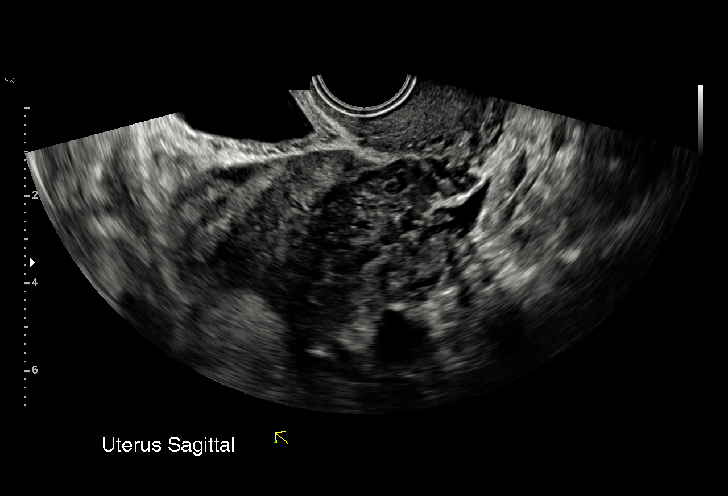
[im 43/62]
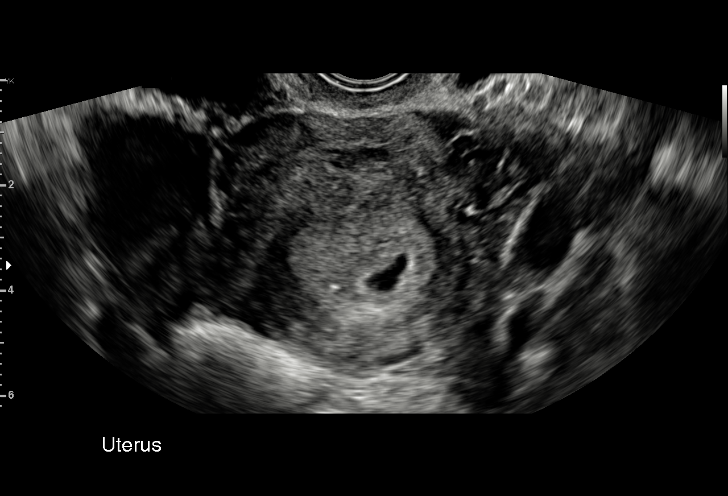
[im 48/62]
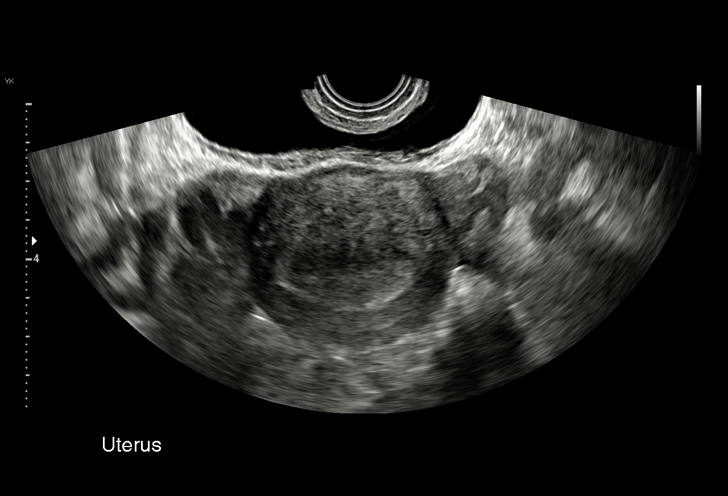
[im 52/62]
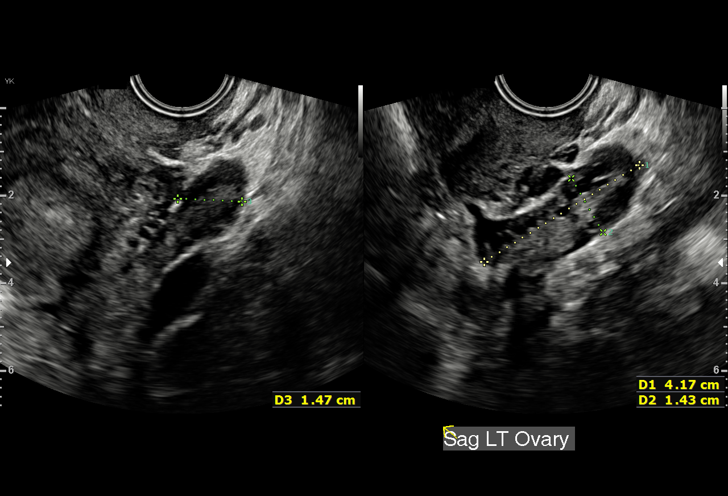
[im 57/62]
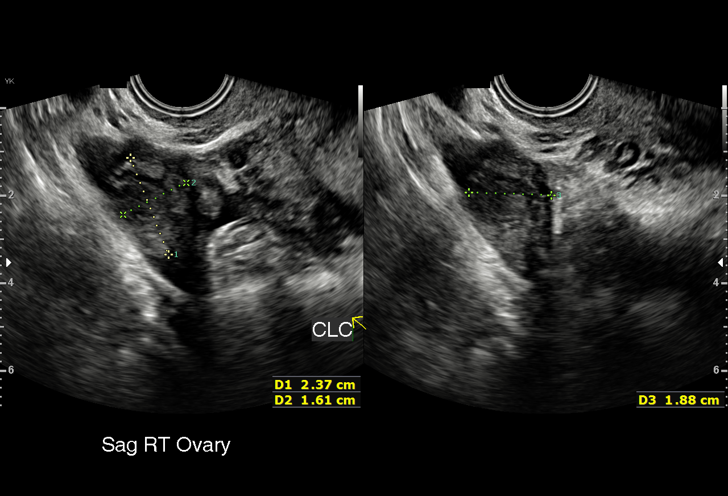
[im 62/62]
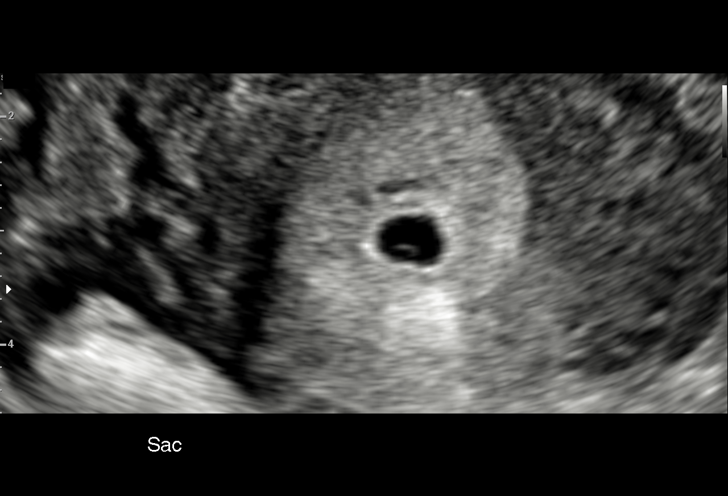

[15 of 28 positions shown; findings below may reference images not displayed]

FINDINGS: Intrauterine gestational sac: Present.  Single.

Yolk sac:  Present

Embryo:  Present

Cardiac Activity: Not visible at this size.

MSD: 8.6 mm   5 w   4 d

Subchorionic hemorrhage:  None visualized.

Maternal uterus/adnexae: Corpus luteum cyst on the right measuring
2.4 x 1.6 x 1.9 cm. Ovaries otherwise appear normal. The uterus
shows the presence of a subserosal fibroid in the posterior body
measuring 1.5 x 1 x 1 cm.
IMPRESSION: Normal appearing early intrauterine pregnancy with a single
gestational sac containing a yolk sac and embryo. By mean sac
diameter, gestational age is 5 weeks 4 days. No sign of subchorionic
hemorrhage. Small subserosal posterior body fibroid.

## 2021-05-01 IMAGING — US US MFM UA CORD DOPPLER
1 series · 15 of 22 positions shown · non-contrast
Comparison: none

[Series 1: us mfm ua cord doppler · 15 of 22 slices shown]
[im 1/22]
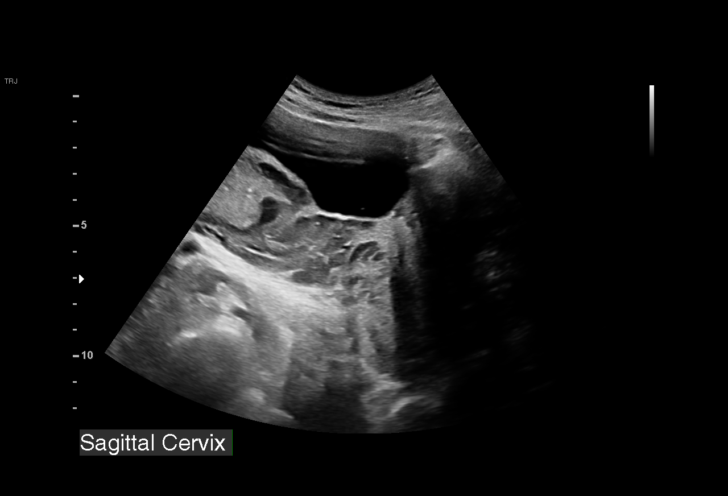
[im 3/22]
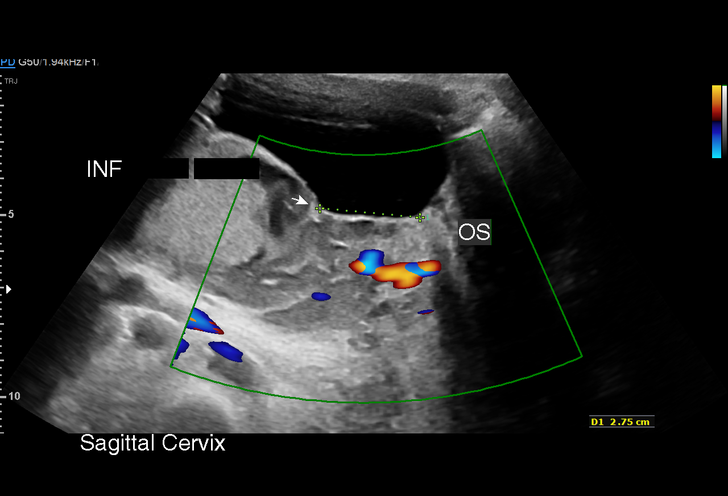
[im 4/22]
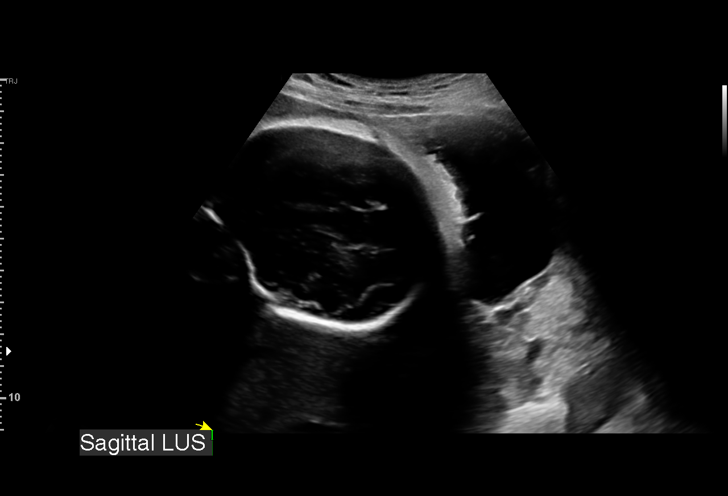
[im 6/22]
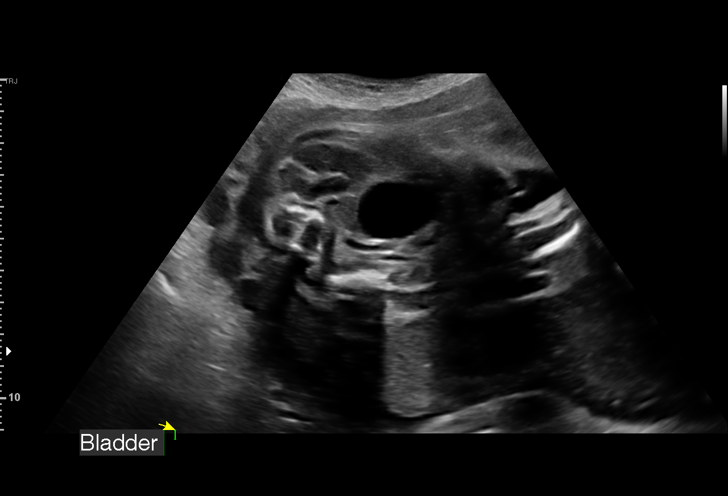
[im 7/22]
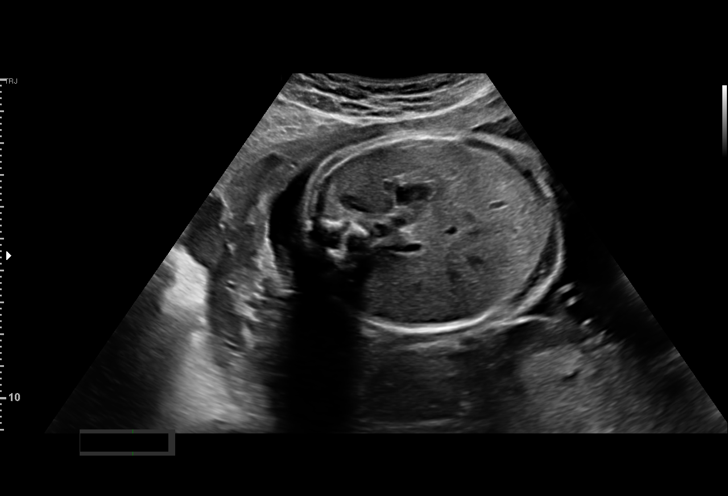
[im 9/22]
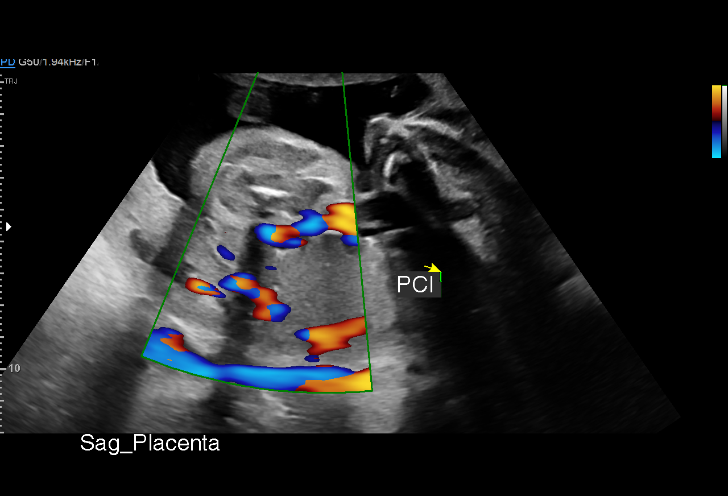
[im 10/22]
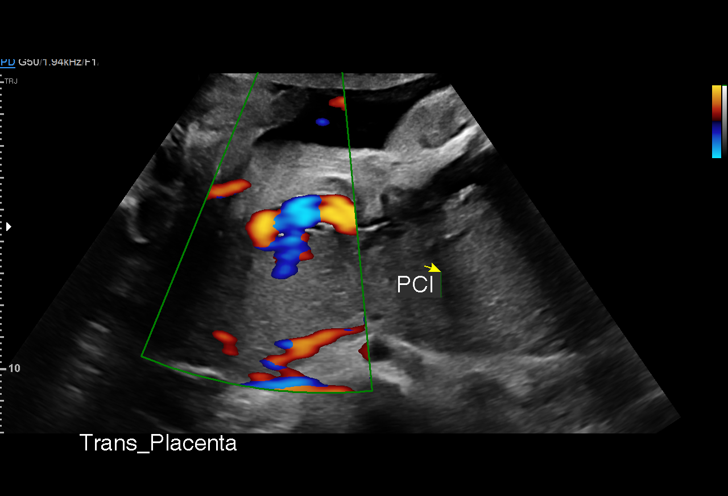
[im 12/22]
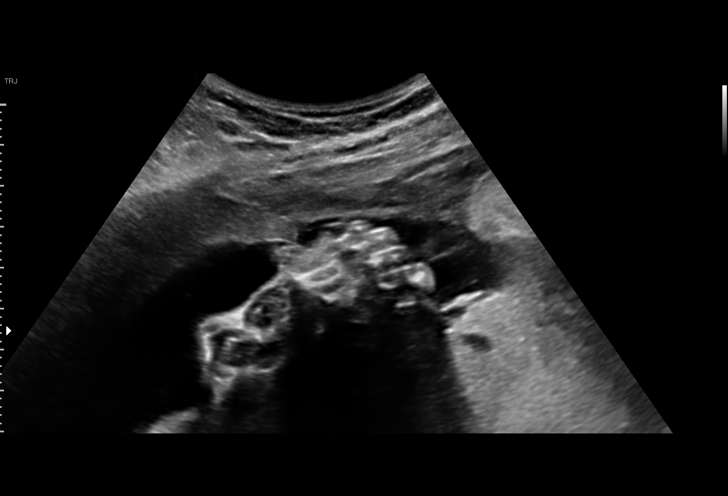
[im 13/22]
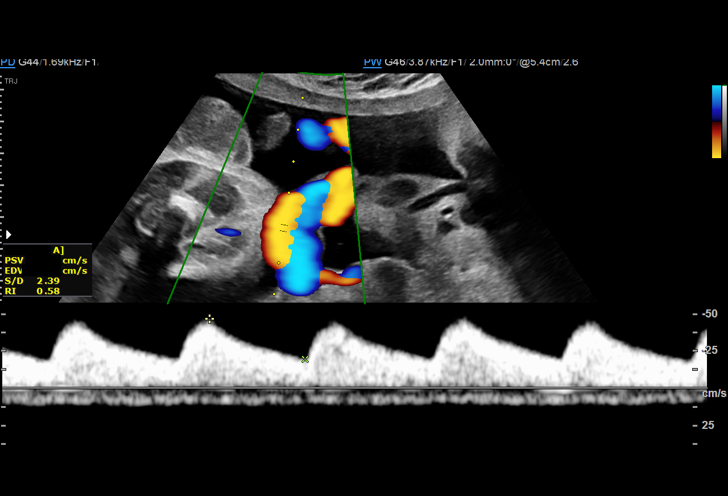
[im 14/22]
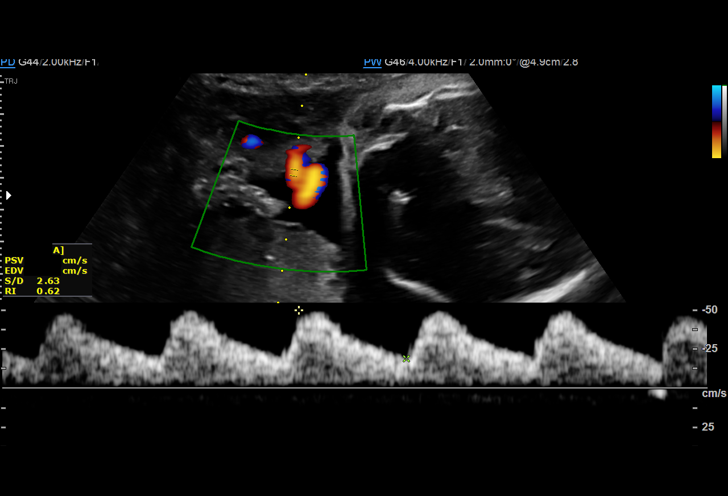
[im 16/22]
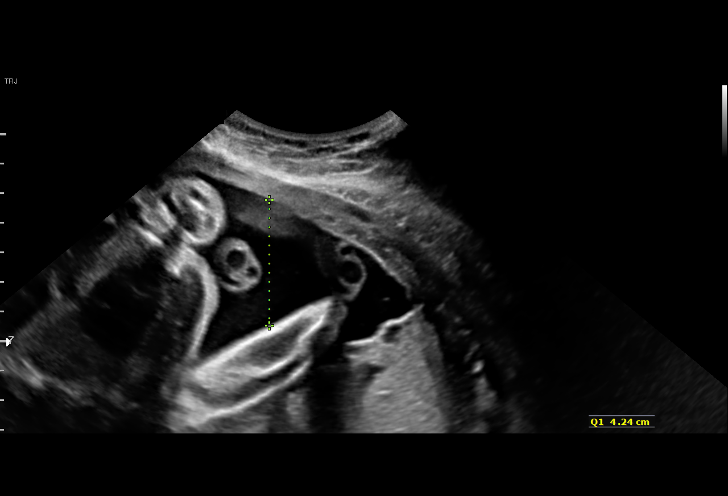
[im 17/22]
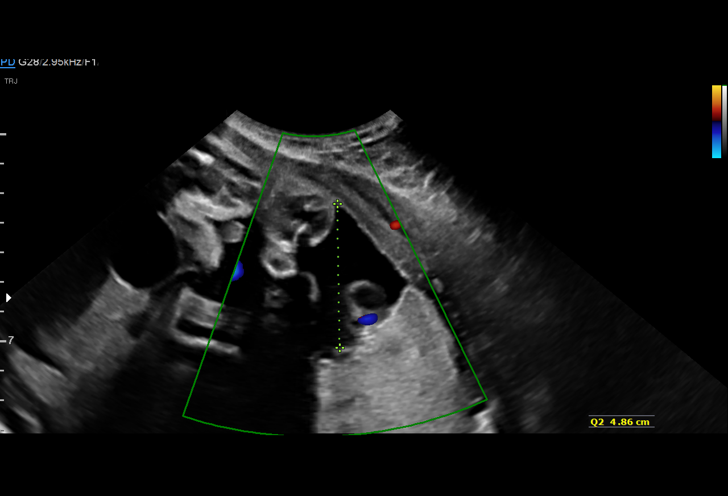
[im 19/22]
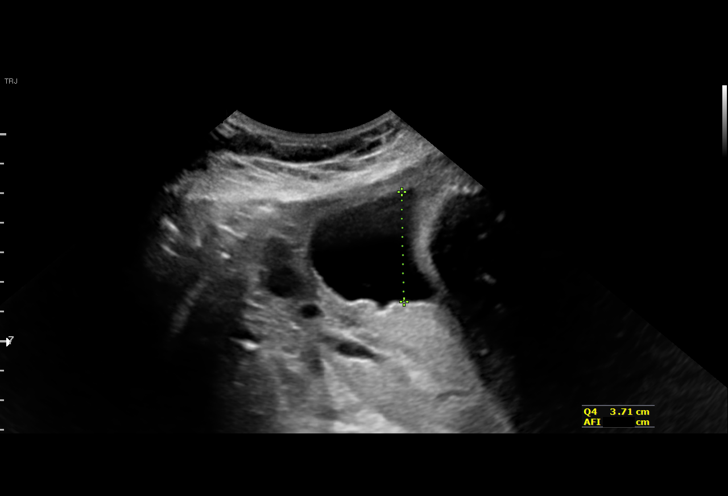
[im 20/22]
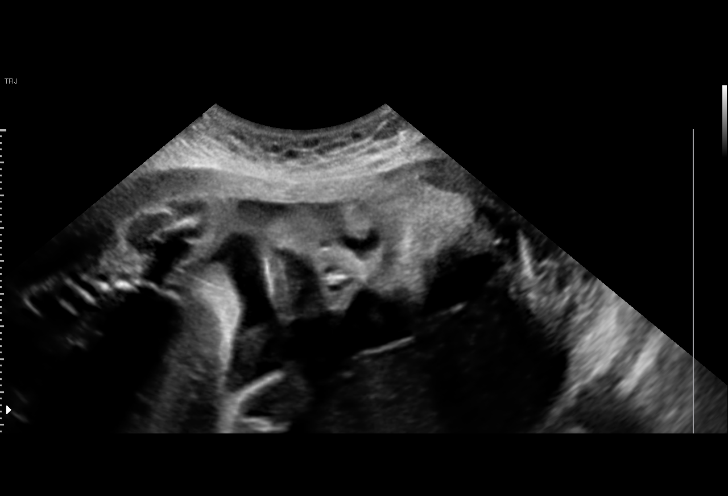
[im 22/22]
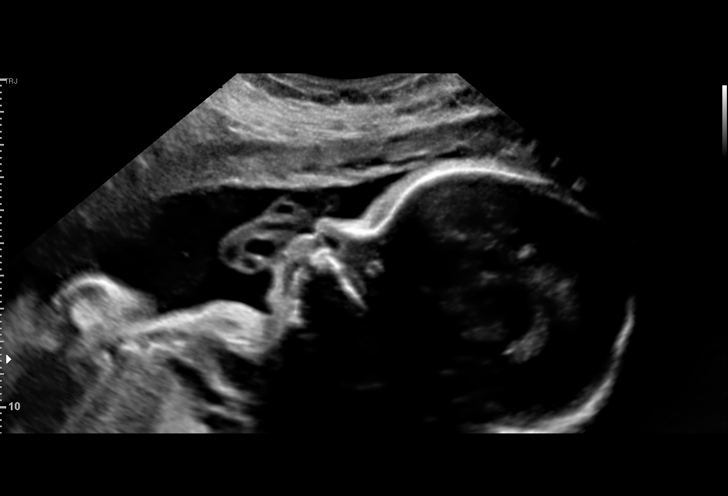

[15 of 22 positions shown; findings below may reference images not displayed]

OB/GYN &
                                                            [facility]

 ----------------------------------------------------------------------

 ----------------------------------------------------------------------
Indications

  30 weeks gestation of pregnancy
  Placenta previa specified as without
  hemorrhage, second trimester
  Maternal care for known or suspected poor
  fetal growth, third trimester
  Small for gestational age fetus affecting
  management of mother
 ----------------------------------------------------------------------
Vital Signs

                                                Height:        5'1"
Fetal Evaluation

 Num Of Fetuses:         1
 Fetal Heart Rate(bpm):  135
 Cardiac Activity:       Observed
 Presentation:           Cephalic
 Placenta:               Posterior
 P. Cord Insertion:      Visualized

 Amniotic Fluid
 AFI FV:      Within normal limits

 AFI Sum(cm)     %Tile       Largest Pocket(cm)
 18.05           68
 RUQ(cm)       RLQ(cm)       LUQ(cm)        LLQ(cm)

Gestational Age

 Best:          30w 4d     Det. By:  Early Ultrasound         EDD:   05/04/19
Doppler - Fetal Vessels

 Umbilical Artery
  S/D     %tile                                            ADFV    RDFV
  2.4       25                                                No      No

Impression

 Fetal growth restriction. Amniotic fluid is normal and good
 fetal activity is seen. Umbilical artery Doppler showed normal
 forward diastolic flow. No evidence of marginal cord insertion
 or placenta previa.
Recommendations

 -UA Doppler next week.
 -Fetal growth assessment in 2 weeks.
                 Fars, Abo Yousef

## 2021-05-08 IMAGING — US US MFM UA CORD DOPPLER
1 series · 12 of 17 positions shown · non-contrast
Comparison: none

[Series 1: us mfm ua cord doppler · 17 acquisitions, 12 frames shown]
[im 1/17]
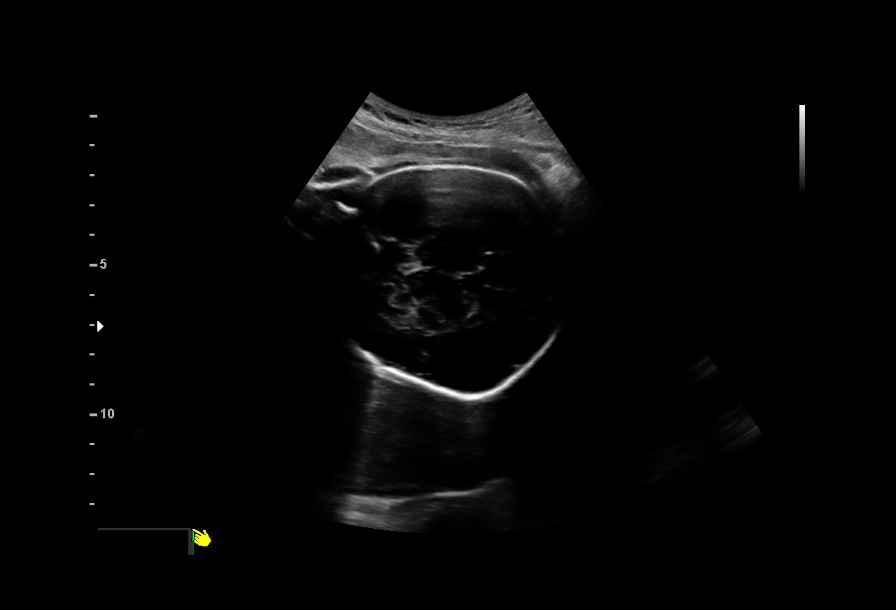
[im 3/17]
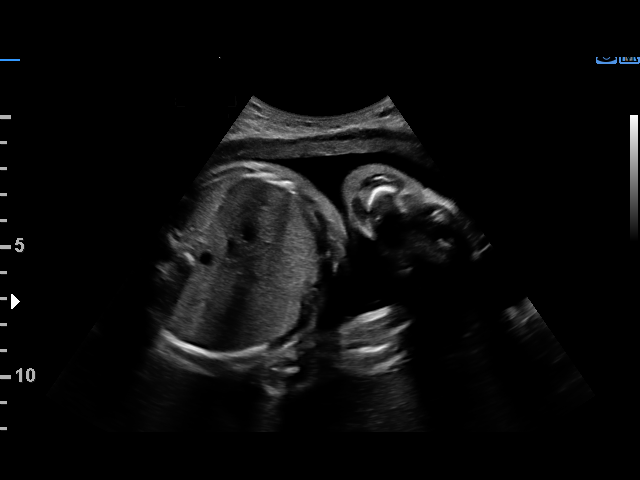
[im 4/17]
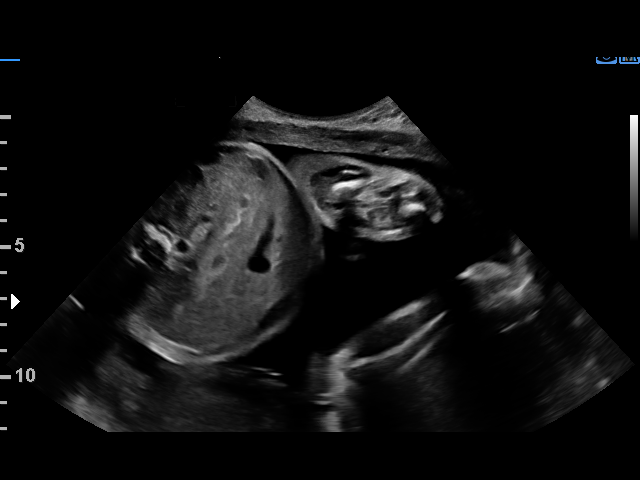
[im 5/17]
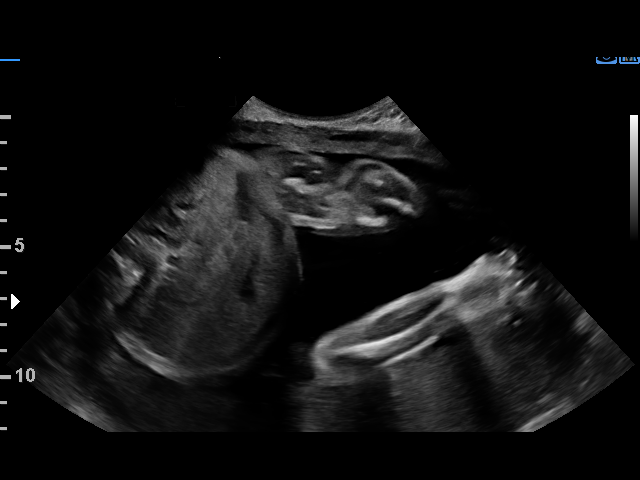
[im 7/17]
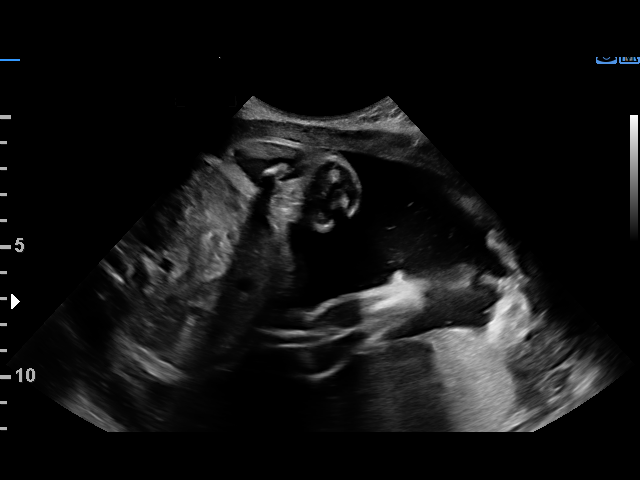
[im 8/17]
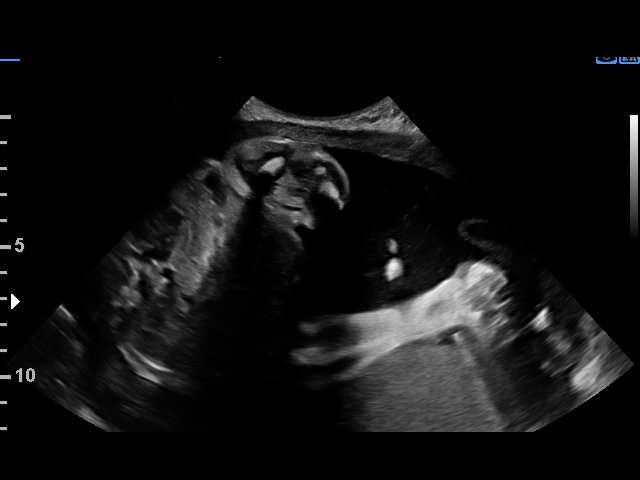
[im 10/17]
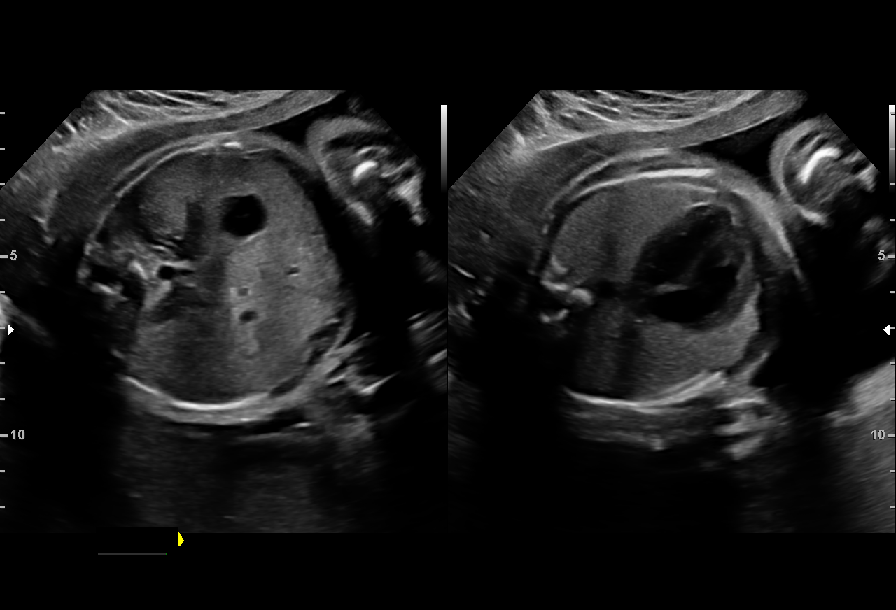
[im 11/17]
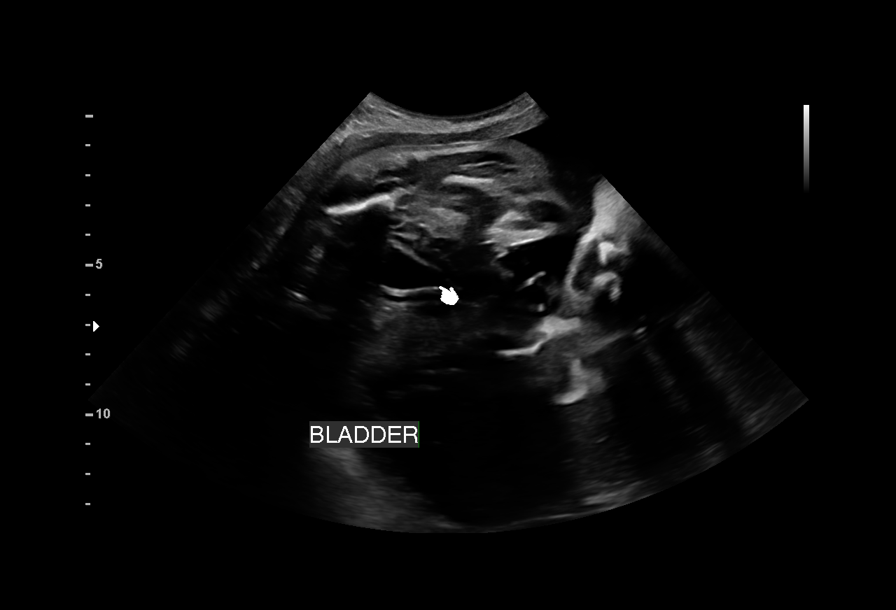
[im 13/17]
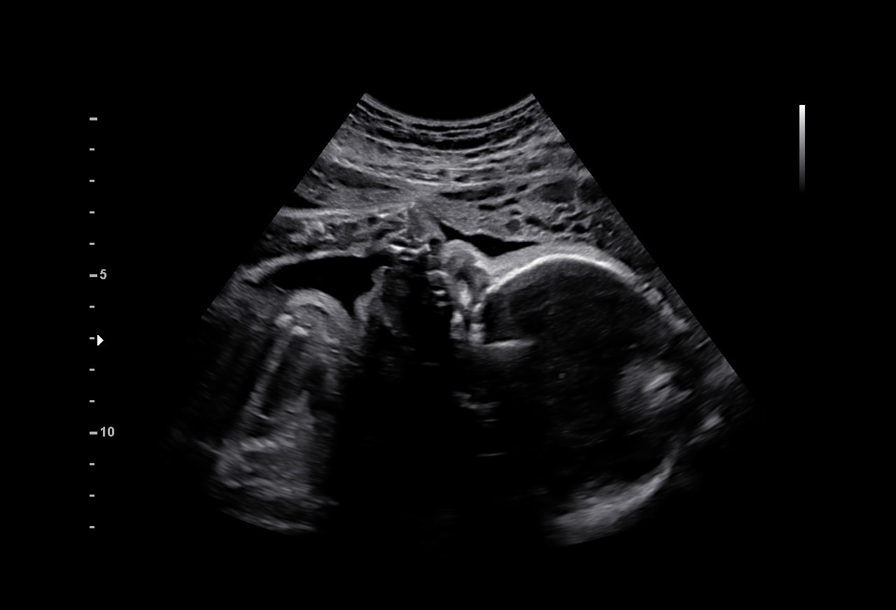
[im 14/17]
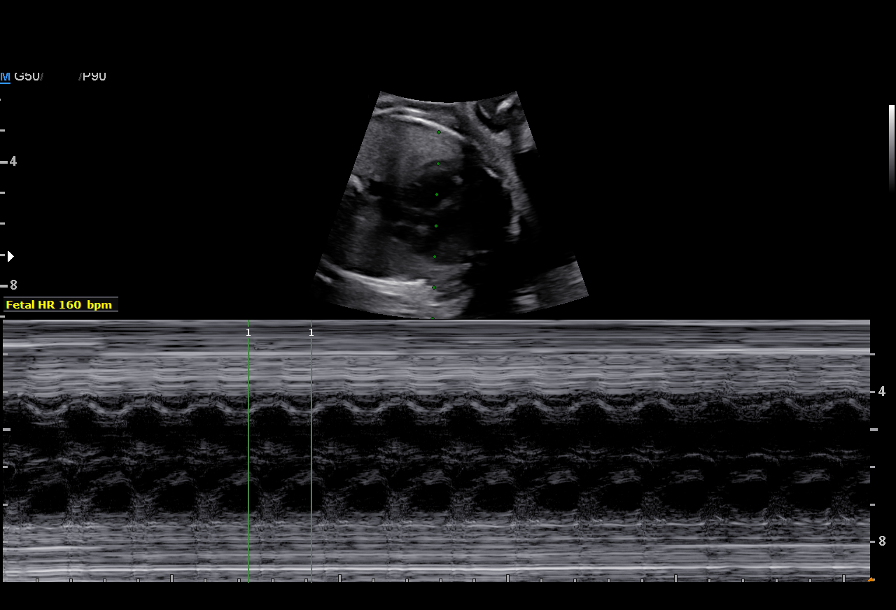
[im 15/17]
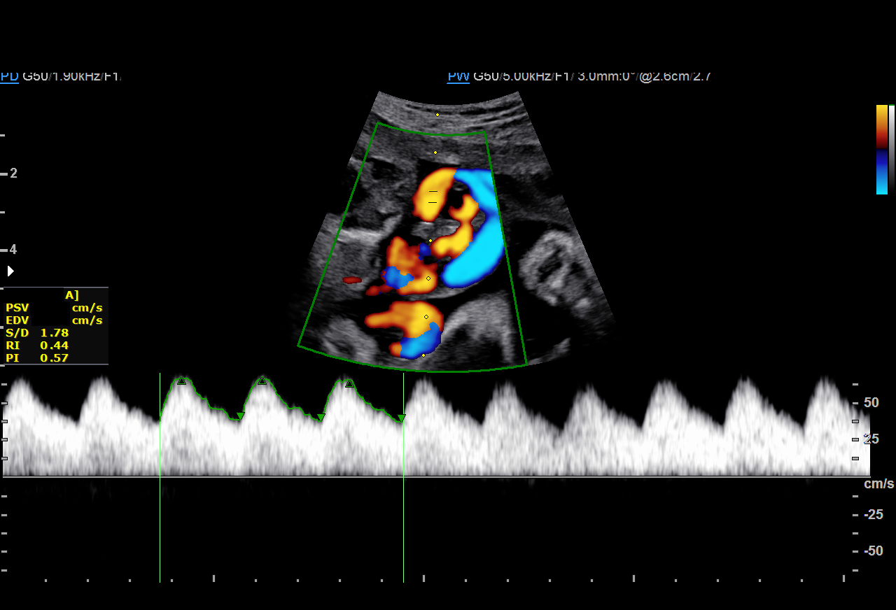
[im 17/17]
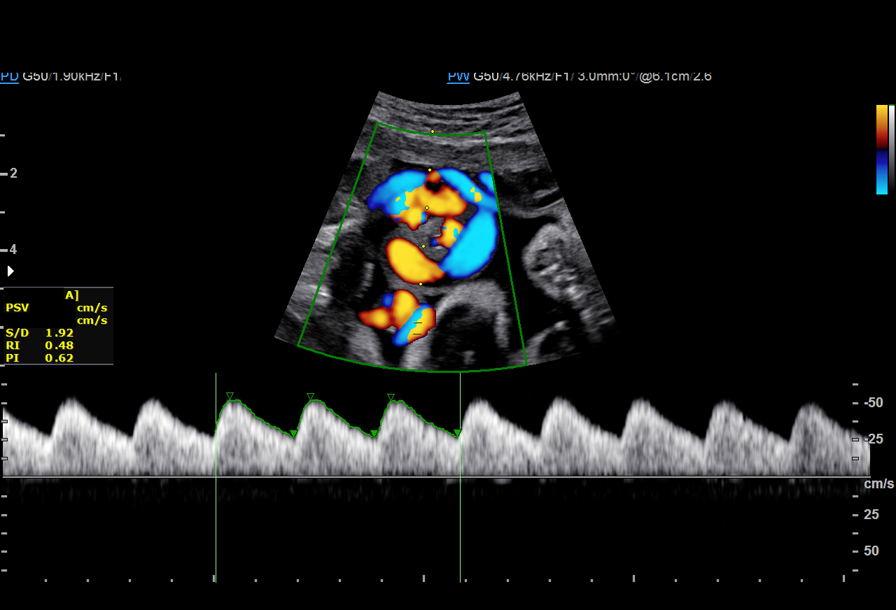

[12 of 17 positions shown; findings below may reference images not displayed]

OB/GYN &
                                                             [facility]

 ----------------------------------------------------------------------

 ----------------------------------------------------------------------
Indications

  31 weeks gestation of pregnancy
  Maternal care for known or suspected poor
  fetal growth, third trimester
  Small for gestational age fetus affecting
  management of mother
  Fetal abnormality - other known or
  suspected (i.e. choriod plexus cyst, EIF,
  renal pyelectasis)
  Marginal insertion of umbilical cord affecting
  management of mother in second
  trimester(AFP Negative)
 ----------------------------------------------------------------------
Vital Signs

                                                Height:        5'1"
Fetal Evaluation

 Num Of Fetuses:          1
 Fetal Heart Rate(bpm):   160
 Cardiac Activity:        Observed
 Presentation:            Cephalic

 Amniotic Fluid
 AFI FV:      Within normal limits
Gestational Age

 Best:          31w 4d     Det. By:  Early Ultrasound         EDD:   05/04/19
Anatomy

 Thoracic:              Appears normal         Bladder:                Appears normal
 Stomach:               Appears normal, left
                        sided
Doppler - Fetal Vessels

 Umbilical Artery
  S/D     %tile     RI              PI                     ADFV    RDFV
 1.89         6  0.47             0.62                         N        N

Impression

 Normal UA Dopplers
 Prior study demonstrated fetal growth restriction EFW 9th%
 Marginal cord insertion was not appreciated in the prior exam
 nor was a placenta previa.
 Due to late gestational age it was difficult to assess the cord
 insertion and placenta.
Recommendations

 Follow up growth in 1 weeks
 Continue weekly testing.

## 2024-08-29 ENCOUNTER — Ambulatory Visit: Admitting: Podiatry
